# Patient Record
Sex: Male | Born: 1970 | Race: White | Hispanic: No | Marital: Single | Smoking: Never smoker
Health system: Southern US, Community
[De-identification: ages and names within clinical notes are randomized; demographics above are authoritative.]

## PROBLEM LIST (undated history)

## (undated) DIAGNOSIS — J939 Pneumothorax, unspecified: Secondary | ICD-10-CM

## (undated) DIAGNOSIS — F1010 Alcohol abuse, uncomplicated: Secondary | ICD-10-CM

## (undated) DIAGNOSIS — J45909 Unspecified asthma, uncomplicated: Secondary | ICD-10-CM

## (undated) HISTORY — PX: LUNG SURGERY: SHX703

---

## 2007-10-24 ENCOUNTER — Emergency Department: Payer: Self-pay | Admitting: Emergency Medicine

## 2008-08-23 ENCOUNTER — Emergency Department: Payer: Self-pay | Admitting: Emergency Medicine

## 2008-10-01 ENCOUNTER — Emergency Department: Payer: Self-pay | Admitting: Emergency Medicine

## 2008-10-14 ENCOUNTER — Emergency Department: Payer: Self-pay | Admitting: Emergency Medicine

## 2008-11-12 ENCOUNTER — Emergency Department: Payer: Self-pay | Admitting: Emergency Medicine

## 2009-05-07 ENCOUNTER — Ambulatory Visit: Payer: Self-pay | Admitting: Gastroenterology

## 2009-12-06 ENCOUNTER — Inpatient Hospital Stay: Payer: Self-pay | Admitting: *Deleted

## 2010-09-29 ENCOUNTER — Inpatient Hospital Stay: Payer: Self-pay | Admitting: Internal Medicine

## 2011-07-31 IMAGING — CR DG CHEST 2V
1 series · 3 of 3 positions shown · non-contrast
Comparison: none

REASON FOR EXAM: COUGH, ELEVATED WBC
COMMENTS:

PROCEDURE:     DXR - DXR CHEST PA (OR AP) AND LATERAL  - December 06, 2009  [DATE]
RESULT:     Comparison is made to the study 23 August, 2008.
The lungs are well-expanded and clear. The heart is normal in size. The
pulmonary vascularity is not engorged. There is no pleural effusion.

[Series 1: view not recorded · 0.17mm/px · 3 of 3 slices shown]
[im 1/3]
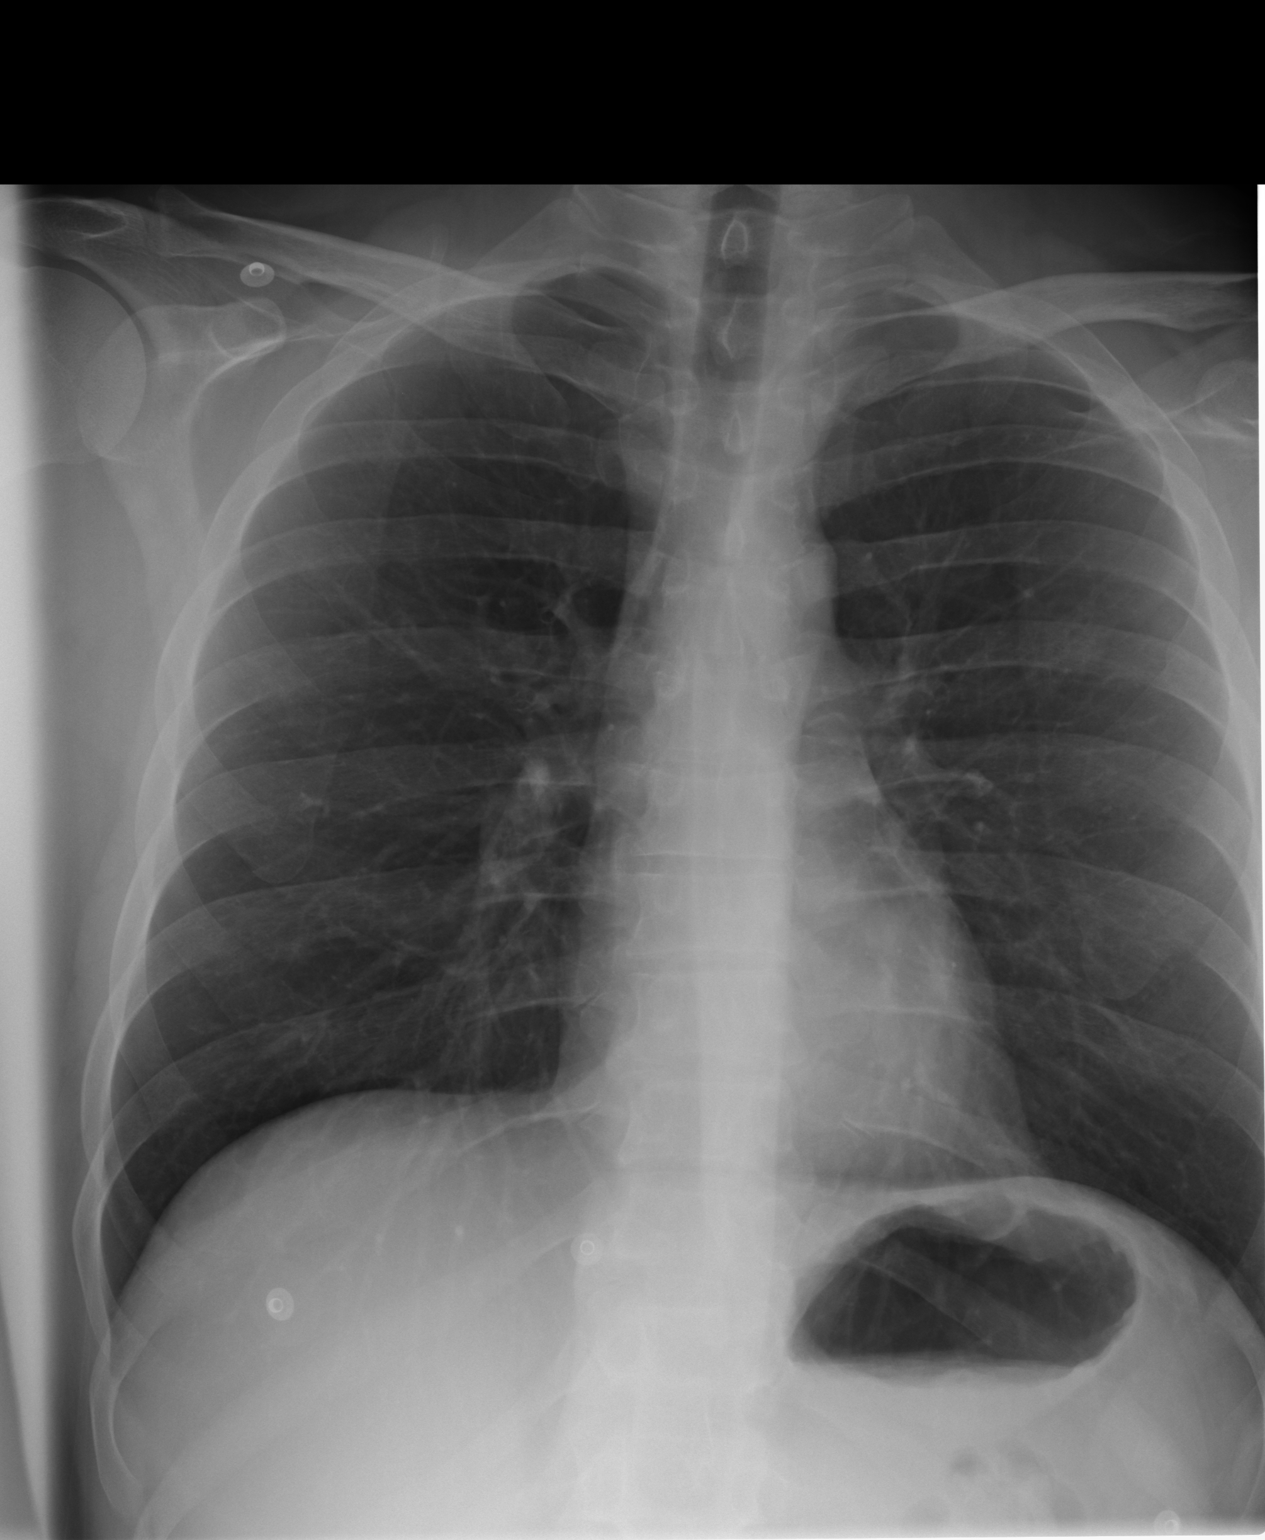
[im 2/3]
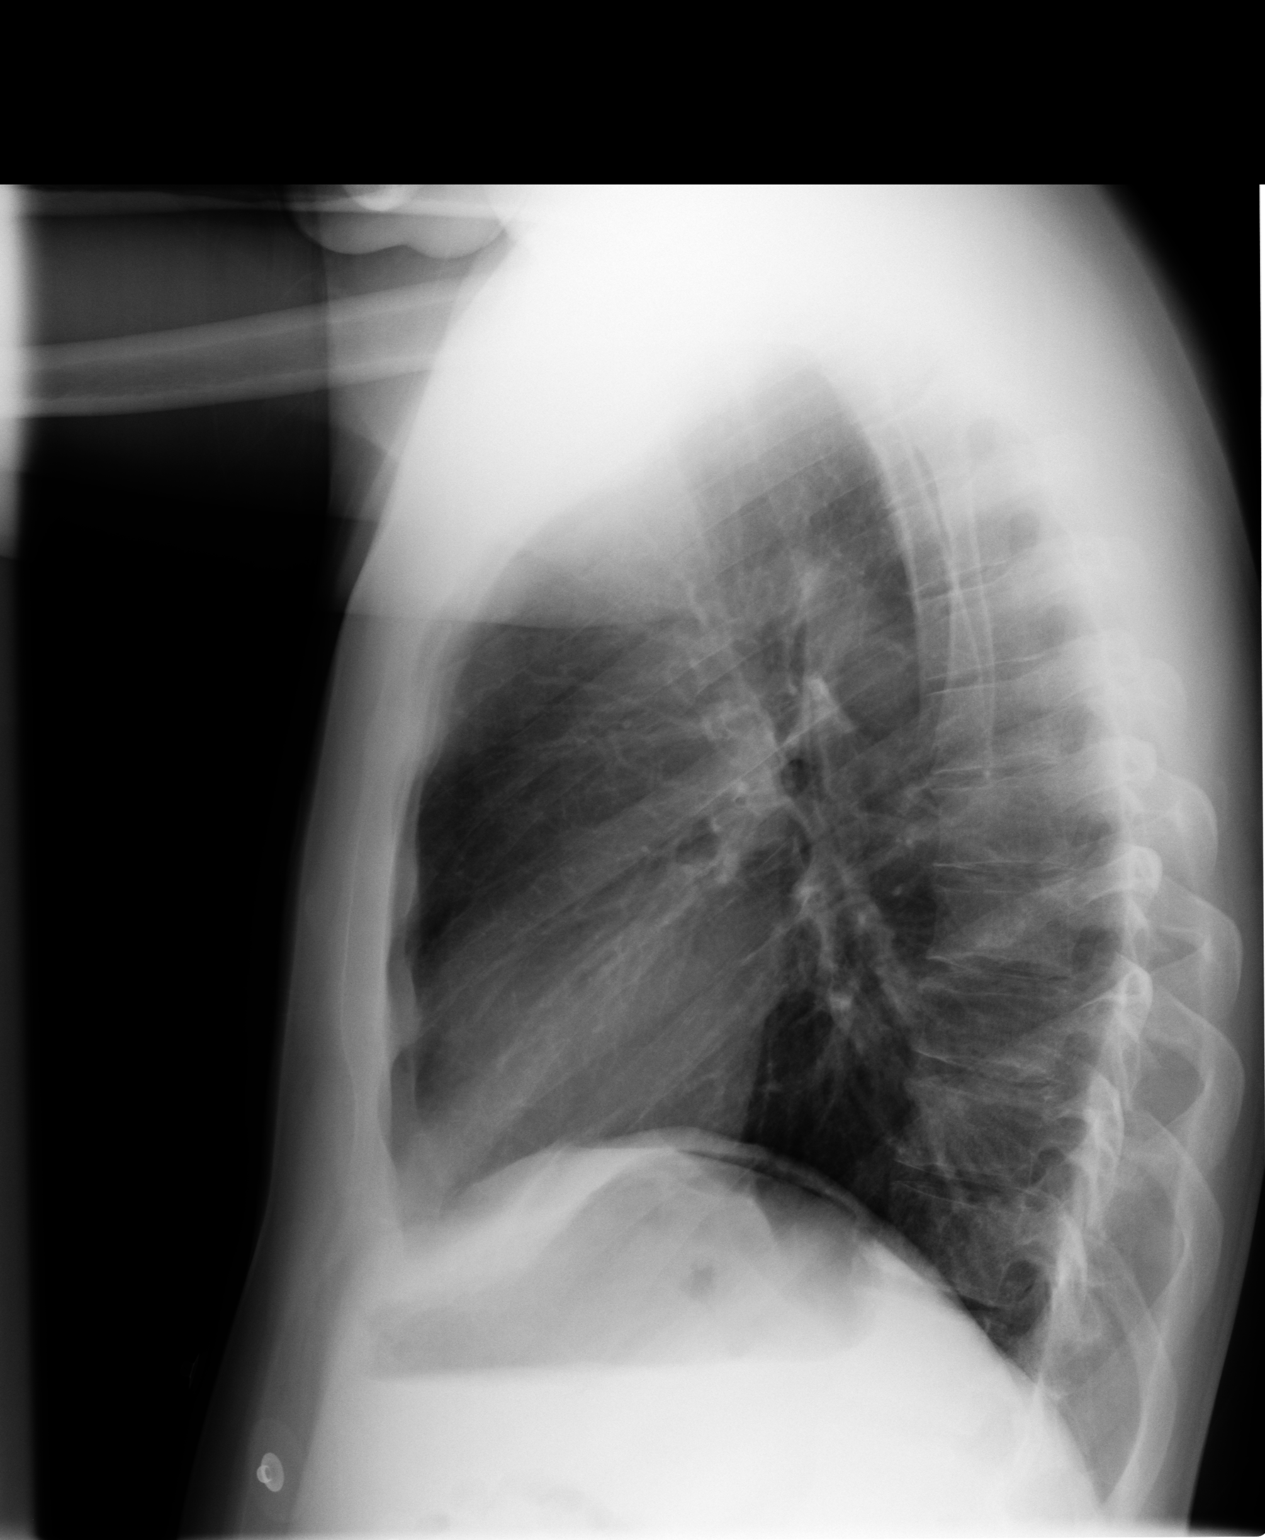
[im 3/3]
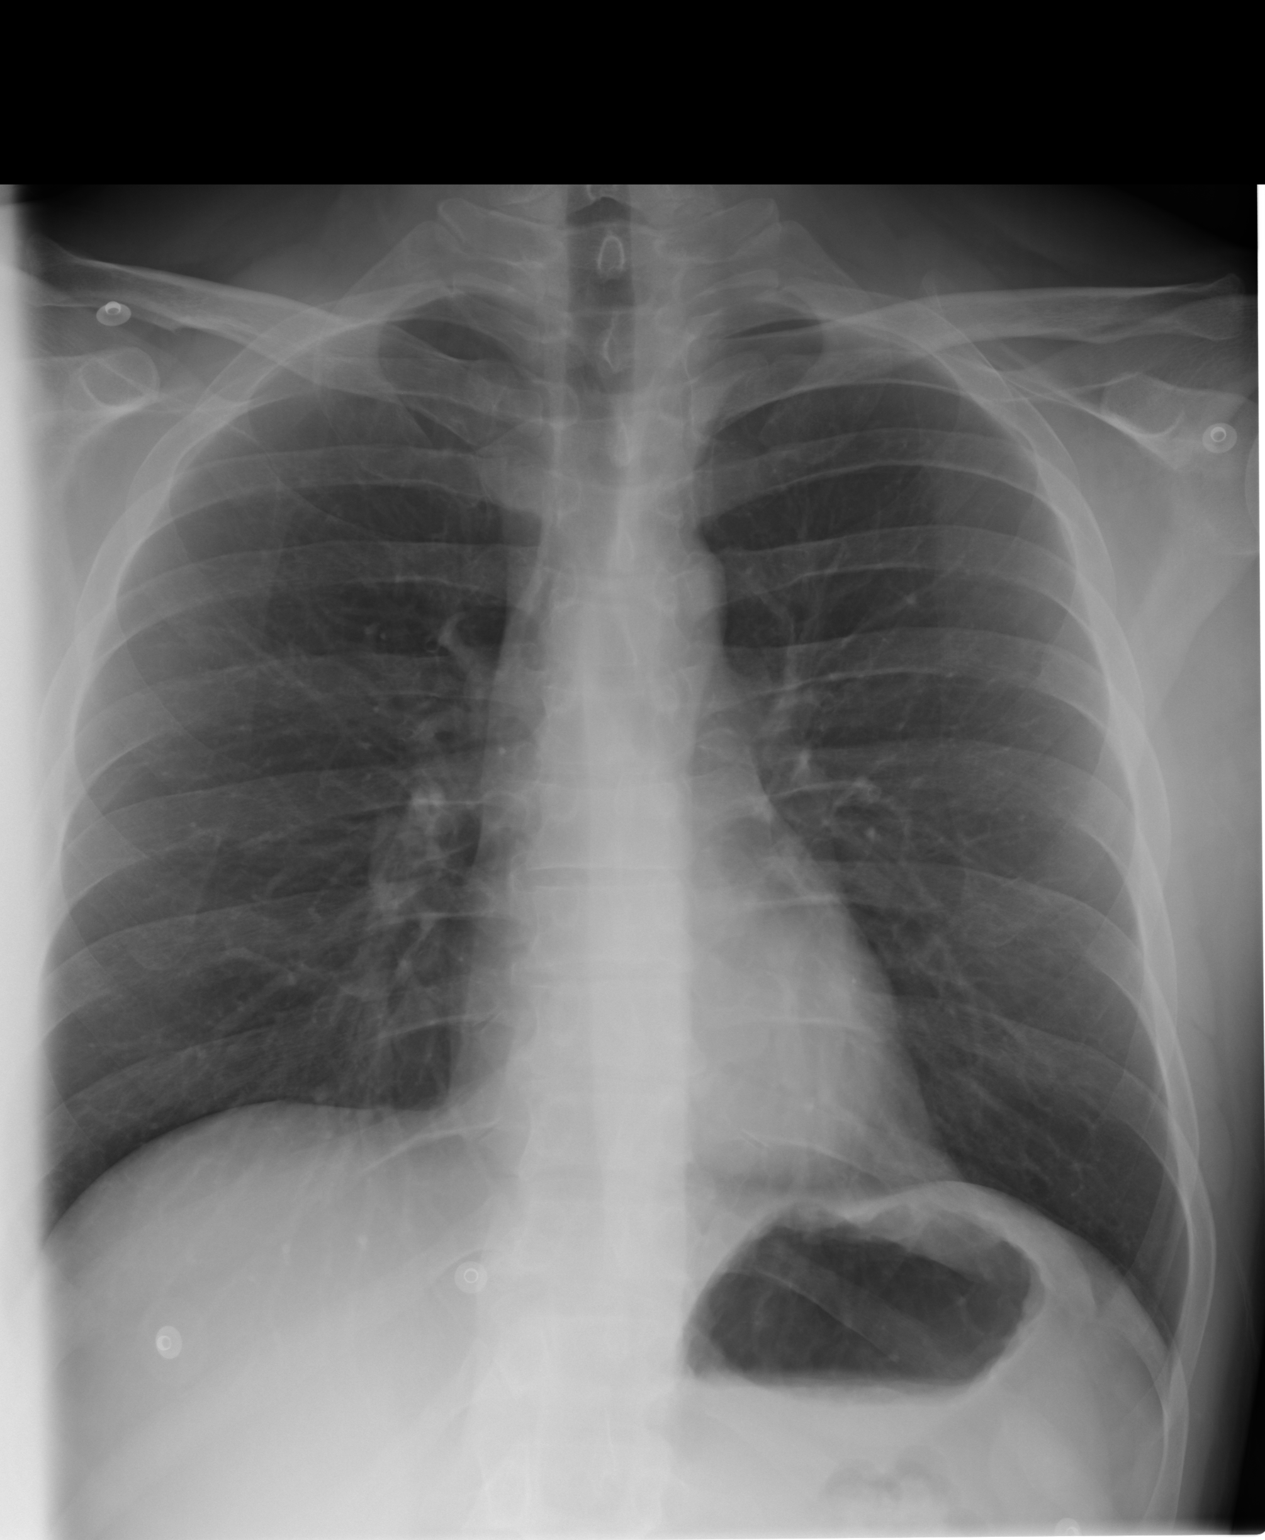

[3 of 3 positions shown; findings below may reference images not displayed]

IMPRESSION: I do not see evidence of acute cardiopulmonary abnormality.

## 2012-02-09 ENCOUNTER — Ambulatory Visit: Payer: Self-pay | Admitting: Internal Medicine

## 2012-02-12 ENCOUNTER — Ambulatory Visit: Payer: Self-pay | Admitting: Orthopedic Surgery

## 2012-07-19 ENCOUNTER — Emergency Department: Payer: Self-pay | Admitting: Emergency Medicine

## 2012-07-19 LAB — URINALYSIS, COMPLETE
Bilirubin,UR: NEGATIVE
Glucose,UR: NEGATIVE mg/dL (ref 0–75)
Ketone: NEGATIVE
RBC,UR: <1 /HPF (ref 0–5)
Specific Gravity: 1.003 (ref 1.003–1.030)
Squamous Epithelial: <1

## 2012-07-19 LAB — CBC
HCT: 46.4 % (ref 40.0–52.0)
MCH: 31.9 pg (ref 26.0–34.0)
MCHC: 33 g/dL (ref 32.0–36.0)
MCV: 97 fL (ref 80–100)
RBC: 4.79 10*6/uL (ref 4.40–5.90)
WBC: 6.6 10*3/uL (ref 3.8–10.6)

## 2012-07-19 LAB — COMPREHENSIVE METABOLIC PANEL
Albumin: 4.4 g/dL (ref 3.4–5.0)
Anion Gap: 13 (ref 7–16)
BUN: 6 mg/dL — ABNORMAL LOW (ref 7–18)
Bilirubin,Total: 0.4 mg/dL (ref 0.2–1.0)
Chloride: 99 mmol/L (ref 98–107)
Co2: 23 mmol/L (ref 21–32)
Creatinine: 0.72 mg/dL (ref 0.60–1.30)
EGFR (African American): >60
SGPT (ALT): 53 U/L (ref 12–78)
Total Protein: 8.8 g/dL — ABNORMAL HIGH (ref 6.4–8.2)

## 2012-07-19 LAB — DRUG SCREEN, URINE
Amphetamines, Ur Screen: NEGATIVE (ref ?–1000)
Barbiturates, Ur Screen: NEGATIVE (ref ?–200)
Benzodiazepine, Ur Scrn: NEGATIVE (ref ?–200)
Cannabinoid 50 Ng, Ur ~~LOC~~: NEGATIVE (ref ?–50)
Methadone, Ur Screen: NEGATIVE (ref ?–300)

## 2012-07-19 LAB — TSH: Thyroid Stimulating Horm: 0.78 u[IU]/mL

## 2012-07-20 LAB — ETHANOL: Ethanol %: <0.003 % (ref 0.000–0.080)

## 2012-07-22 ENCOUNTER — Emergency Department: Payer: Self-pay | Admitting: Unknown Physician Specialty

## 2013-05-24 ENCOUNTER — Emergency Department: Payer: Self-pay | Admitting: Emergency Medicine

## 2013-05-24 LAB — COMPREHENSIVE METABOLIC PANEL
Albumin: 4.1 g/dL (ref 3.4–5.0)
Alkaline Phosphatase: 72 U/L
Anion Gap: 6 — ABNORMAL LOW (ref 7–16)
BUN: 6 mg/dL — ABNORMAL LOW (ref 7–18)
Bilirubin,Total: 0.2 mg/dL (ref 0.2–1.0)
Co2: 29 mmol/L (ref 21–32)
EGFR (African American): >60
EGFR (Non-African Amer.): >60
Glucose: 96 mg/dL (ref 65–99)
Osmolality: 279 (ref 275–301)
Potassium: 4.2 mmol/L (ref 3.5–5.1)
SGOT(AST): 70 U/L — ABNORMAL HIGH (ref 15–37)
SGPT (ALT): 62 U/L (ref 12–78)
Sodium: 141 mmol/L (ref 136–145)

## 2013-05-24 LAB — CBC
HGB: 14.2 g/dL (ref 13.0–18.0)
MCH: 32.4 pg (ref 26.0–34.0)
Platelet: 270 10*3/uL (ref 150–440)
WBC: 5.3 10*3/uL (ref 3.8–10.6)

## 2013-05-24 LAB — DRUG SCREEN, URINE
Amphetamines, Ur Screen: NEGATIVE (ref ?–1000)
Barbiturates, Ur Screen: NEGATIVE (ref ?–200)
Benzodiazepine, Ur Scrn: NEGATIVE (ref ?–200)
Cannabinoid 50 Ng, Ur ~~LOC~~: NEGATIVE (ref ?–50)
Cocaine Metabolite,Ur ~~LOC~~: NEGATIVE (ref ?–300)
MDMA (Ecstasy)Ur Screen: NEGATIVE (ref ?–500)
Methadone, Ur Screen: NEGATIVE (ref ?–300)
Opiate, Ur Screen: NEGATIVE (ref ?–300)
Tricyclic, Ur Screen: NEGATIVE (ref ?–1000)

## 2013-05-24 LAB — URINALYSIS, COMPLETE
Bacteria: NONE SEEN
Bilirubin,UR: NEGATIVE
Blood: NEGATIVE
Ketone: NEGATIVE
Leukocyte Esterase: NEGATIVE
Nitrite: NEGATIVE
Ph: 5 (ref 4.5–8.0)
Protein: NEGATIVE
Squamous Epithelial: <1
WBC UR: <1 /HPF (ref 0–5)

## 2013-05-24 LAB — ETHANOL
Ethanol %: 0.409 % (ref 0.000–0.080)
Ethanol: 409 mg/dL

## 2013-05-24 LAB — SALICYLATE LEVEL: Salicylates, Serum: <1.7 mg/dL

## 2013-05-26 LAB — COMPREHENSIVE METABOLIC PANEL
Albumin: 3.7 g/dL (ref 3.4–5.0)
Anion Gap: 5 — ABNORMAL LOW (ref 7–16)
Bilirubin,Total: 0.2 mg/dL (ref 0.2–1.0)
Co2: 26 mmol/L (ref 21–32)
Creatinine: 0.89 mg/dL (ref 0.60–1.30)
EGFR (African American): >60
EGFR (Non-African Amer.): >60
Potassium: 4.2 mmol/L (ref 3.5–5.1)
SGOT(AST): 57 U/L — ABNORMAL HIGH (ref 15–37)
SGPT (ALT): 52 U/L (ref 12–78)
Sodium: 136 mmol/L (ref 136–145)
Total Protein: 7.9 g/dL (ref 6.4–8.2)

## 2013-05-26 LAB — ETHANOL
Ethanol %: 0.431 % (ref 0.000–0.080)
Ethanol: 431 mg/dL

## 2013-05-26 LAB — CBC
MCHC: 33.8 g/dL (ref 32.0–36.0)
RDW: 13.2 % (ref 11.5–14.5)

## 2013-05-27 ENCOUNTER — Inpatient Hospital Stay: Payer: Self-pay | Admitting: Psychiatry

## 2013-05-27 LAB — URINALYSIS, COMPLETE
Bilirubin,UR: NEGATIVE
Leukocyte Esterase: NEGATIVE
RBC,UR: <1 /HPF (ref 0–5)
Specific Gravity: 1.017 (ref 1.003–1.030)
Squamous Epithelial: <1
Transitional Epi: <1
WBC UR: <1 /HPF (ref 0–5)

## 2013-05-27 LAB — DRUG SCREEN, URINE
Benzodiazepine, Ur Scrn: POSITIVE (ref ?–200)
Cocaine Metabolite,Ur ~~LOC~~: NEGATIVE (ref ?–300)
MDMA (Ecstasy)Ur Screen: NEGATIVE (ref ?–500)
Methadone, Ur Screen: NEGATIVE (ref ?–300)
Phencyclidine (PCP) Ur S: NEGATIVE (ref ?–25)
Tricyclic, Ur Screen: NEGATIVE (ref ?–1000)

## 2013-06-01 ENCOUNTER — Emergency Department: Payer: Self-pay | Admitting: Emergency Medicine

## 2013-06-01 LAB — COMPREHENSIVE METABOLIC PANEL
ALK PHOS: 49 U/L
Albumin: 3.7 g/dL (ref 3.4–5.0)
Anion Gap: 11 (ref 7–16)
BUN: 12 mg/dL (ref 7–18)
Bilirubin,Total: 0.2 mg/dL (ref 0.2–1.0)
CHLORIDE: 106 mmol/L (ref 98–107)
CREATININE: 1 mg/dL (ref 0.60–1.30)
Calcium, Total: 8.6 mg/dL (ref 8.5–10.1)
Co2: 24 mmol/L (ref 21–32)
Glucose: 118 mg/dL — ABNORMAL HIGH (ref 65–99)
Osmolality: 282 (ref 275–301)
Potassium: 3.5 mmol/L (ref 3.5–5.1)
SGOT(AST): 50 U/L — ABNORMAL HIGH (ref 15–37)
SGPT (ALT): 46 U/L (ref 12–78)
SODIUM: 141 mmol/L (ref 136–145)
Total Protein: 7.7 g/dL (ref 6.4–8.2)

## 2013-06-01 LAB — CBC
HCT: 44.9 % (ref 40.0–52.0)
HGB: 15.3 g/dL (ref 13.0–18.0)
MCH: 33.5 pg (ref 26.0–34.0)
MCHC: 34 g/dL (ref 32.0–36.0)
MCV: 98 fL (ref 80–100)
Platelet: 276 10*3/uL (ref 150–440)
RBC: 4.57 10*6/uL (ref 4.40–5.90)
RDW: 13.3 % (ref 11.5–14.5)
WBC: 6.8 10*3/uL (ref 3.8–10.6)

## 2013-06-01 LAB — ACETAMINOPHEN LEVEL: Acetaminophen: <2 ug/mL

## 2013-06-01 LAB — URINALYSIS, COMPLETE
BACTERIA: NONE SEEN
BILIRUBIN, UR: NEGATIVE
Blood: NEGATIVE
GLUCOSE, UR: NEGATIVE mg/dL (ref 0–75)
Hyaline Cast: 6
KETONE: NEGATIVE
Leukocyte Esterase: NEGATIVE
Nitrite: NEGATIVE
Ph: 5 (ref 4.5–8.0)
Protein: NEGATIVE
RBC,UR: NONE SEEN /HPF (ref 0–5)
SPECIFIC GRAVITY: 1.015 (ref 1.003–1.030)
SQUAMOUS EPITHELIAL: NONE SEEN
WBC UR: <1 /HPF (ref 0–5)

## 2013-06-01 LAB — DRUG SCREEN, URINE
Amphetamines, Ur Screen: NEGATIVE (ref ?–1000)
BENZODIAZEPINE, UR SCRN: POSITIVE (ref ?–200)
Barbiturates, Ur Screen: NEGATIVE (ref ?–200)
CANNABINOID 50 NG, UR ~~LOC~~: NEGATIVE (ref ?–50)
Cocaine Metabolite,Ur ~~LOC~~: NEGATIVE (ref ?–300)
MDMA (Ecstasy)Ur Screen: NEGATIVE (ref ?–500)
METHADONE, UR SCREEN: NEGATIVE (ref ?–300)
Opiate, Ur Screen: NEGATIVE (ref ?–300)
Phencyclidine (PCP) Ur S: NEGATIVE (ref ?–25)
Tricyclic, Ur Screen: NEGATIVE (ref ?–1000)

## 2013-06-01 LAB — ETHANOL
Ethanol %: 0.22 % — ABNORMAL HIGH (ref 0.000–0.080)
Ethanol: 220 mg/dL

## 2013-06-01 LAB — SALICYLATE LEVEL: Salicylates, Serum: <1.7 mg/dL

## 2013-10-03 IMAGING — CT CT HEAD WITHOUT CONTRAST
1 series · 16 of 30 positions shown, 20 images · non-contrast
Comparison: none

REASON FOR EXAM: Call Report [phone] 10ft fall from tree  bruise
dazed sluggish
COMMENTS:

PROCEDURE:     ROQUEL - SINDI THALER WITHOUT CONTRAST  - February 09, 2012 [DATE]
RESULT:     Comparison:  None
TECHNIQUE: Multiple axial images from the foramen magnum to the vertex were
obtained without IV contrast.

[Series 2: soft tissue · axial · 0.40mm/px · z∈[-69,+86]mm · 16 of 35 slices shown, 20 images]
[im 2/35  brain]
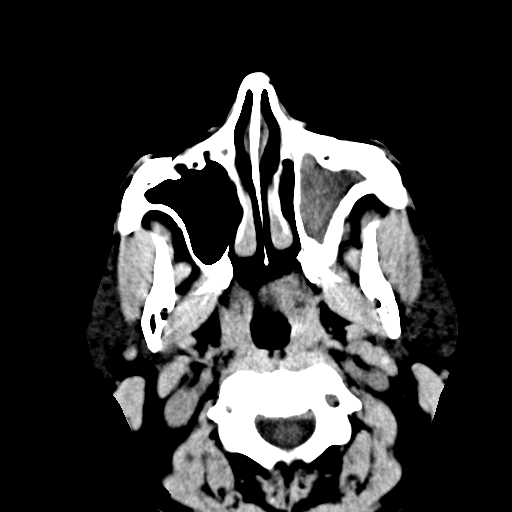
[im 2/35  bone]
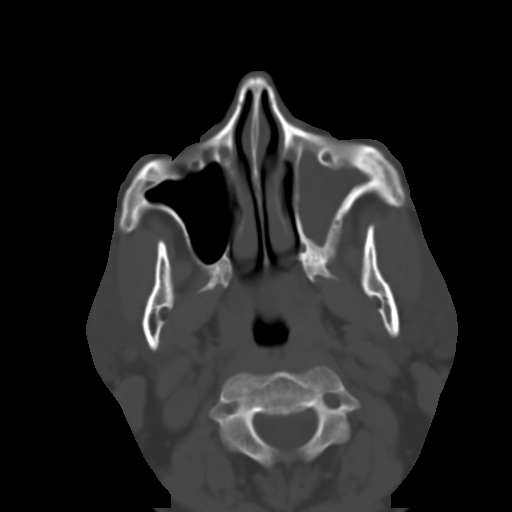
[im 4/35  brain]
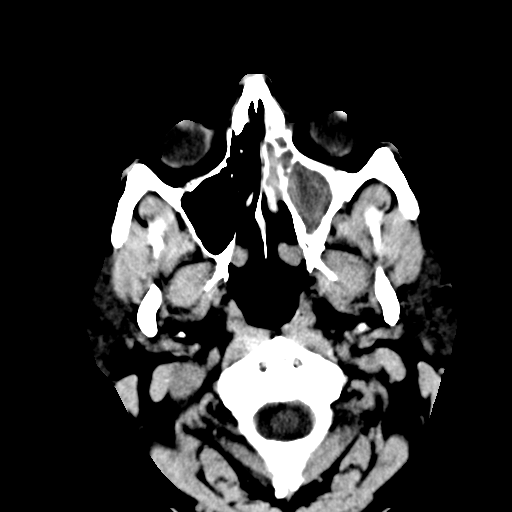
[im 6/35  brain]
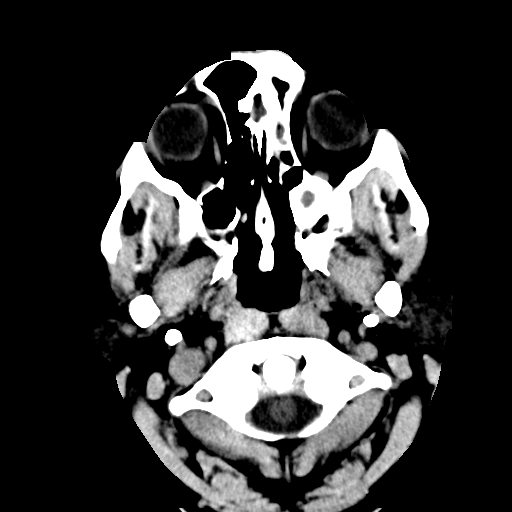
[im 9/35  brain]
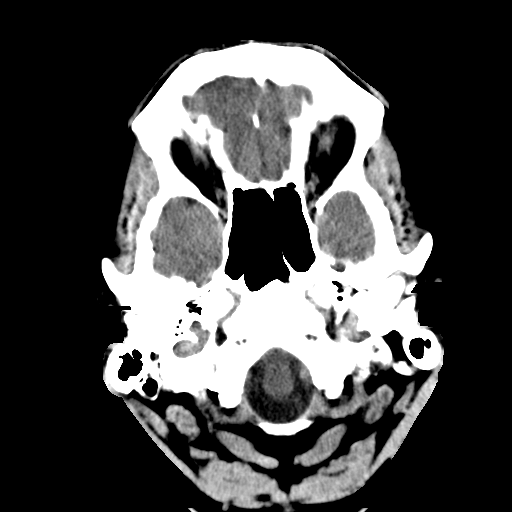
[im 10/35  brain]
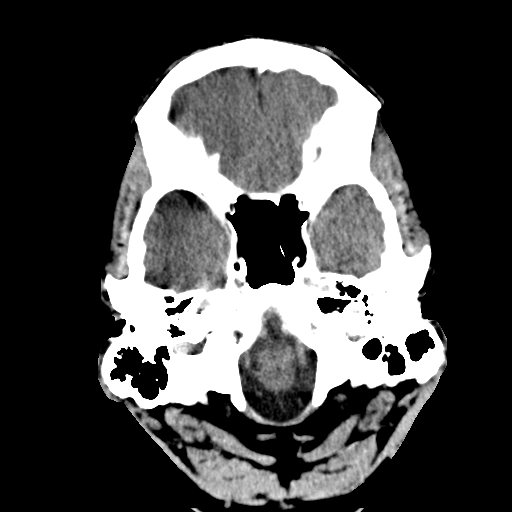
[im 10/35  bone]
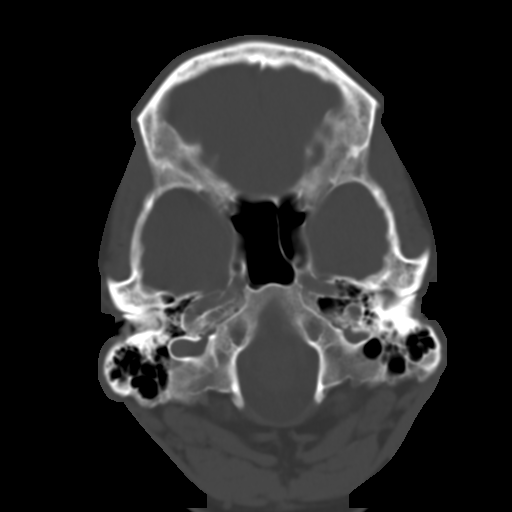
[im 12/35  brain]
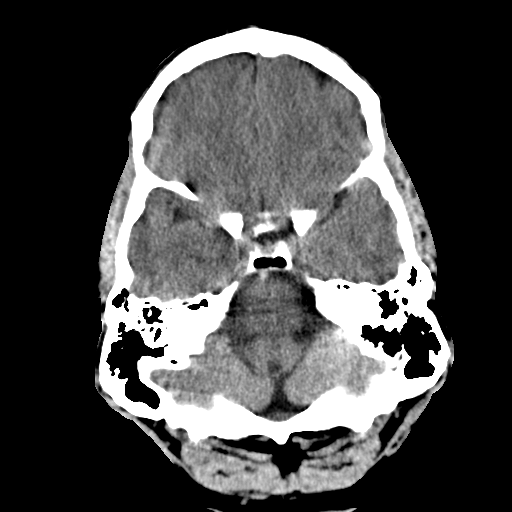
[im 15/35  brain]
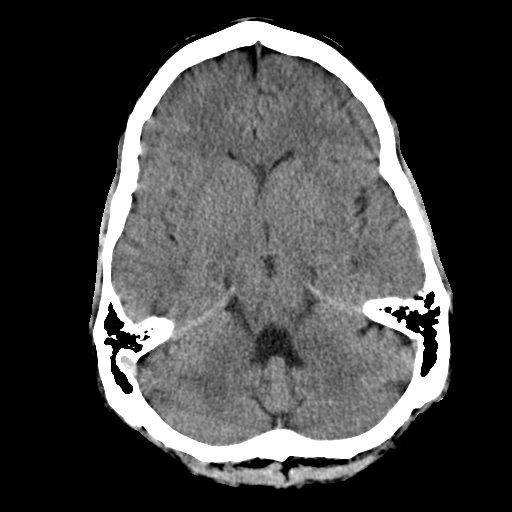
[im 17/35  brain]
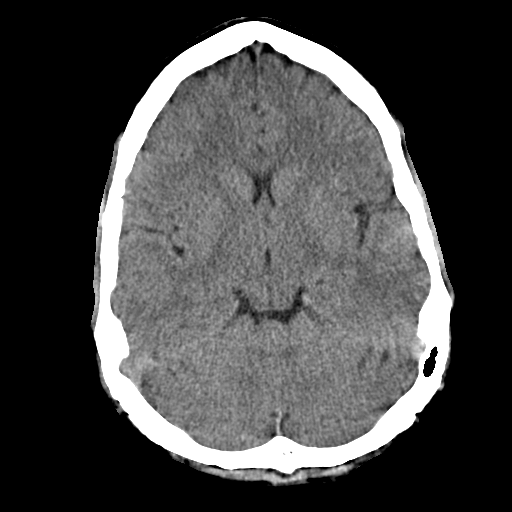
[im 18/35  brain]
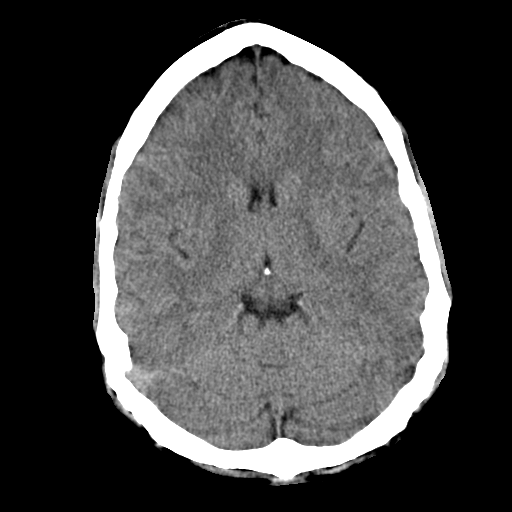
[im 18/35  bone]
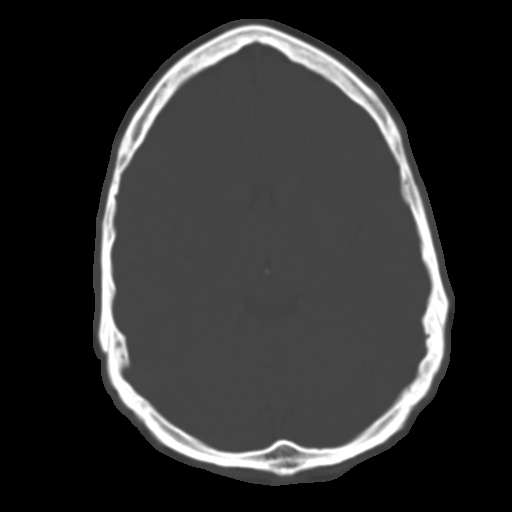
[im 20/35  brain]
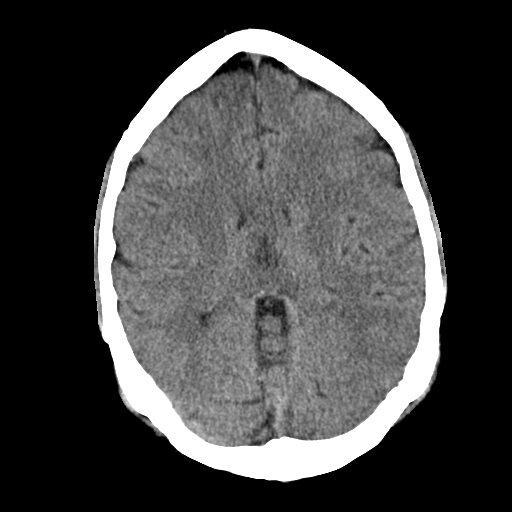
[im 23/35  brain]
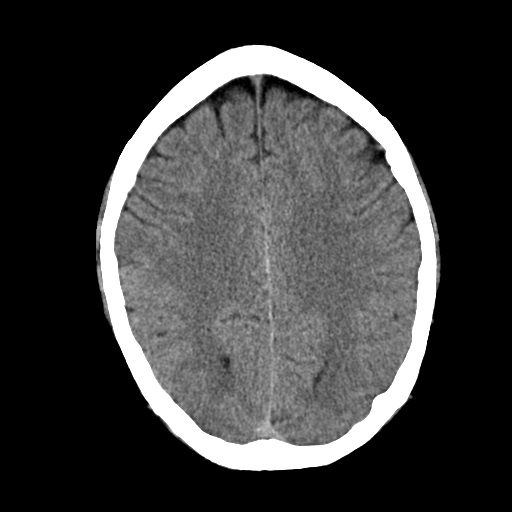
[im 25/35  brain]
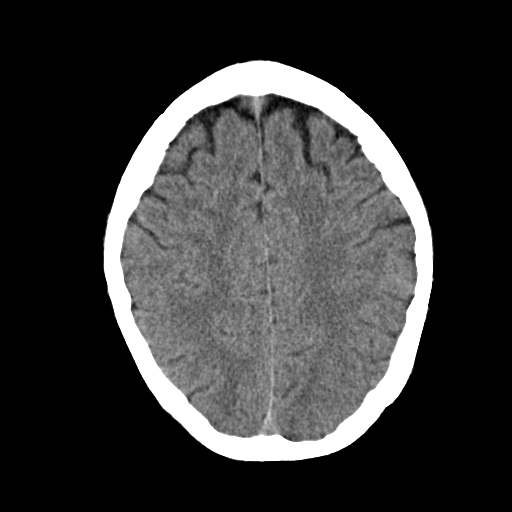
[im 26/35  brain]
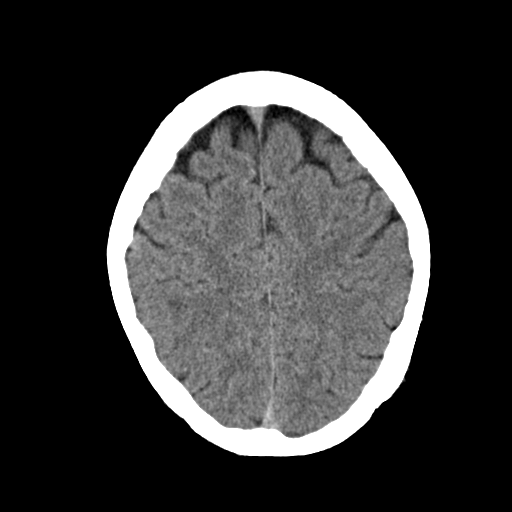
[im 26/35  bone]
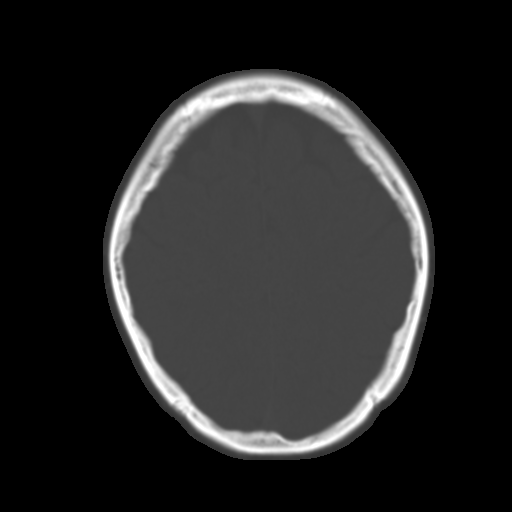
[im 29/35  brain]
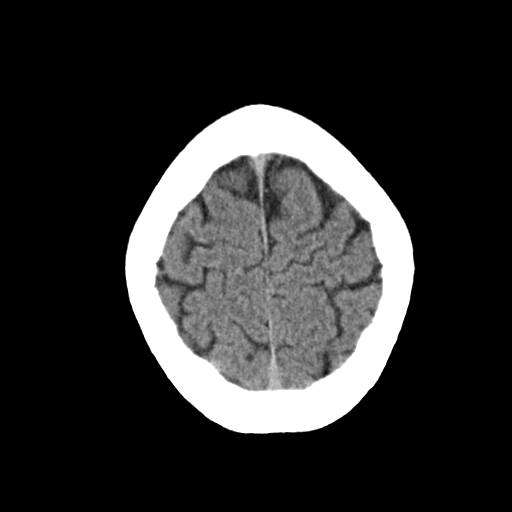
[im 31/35  brain]
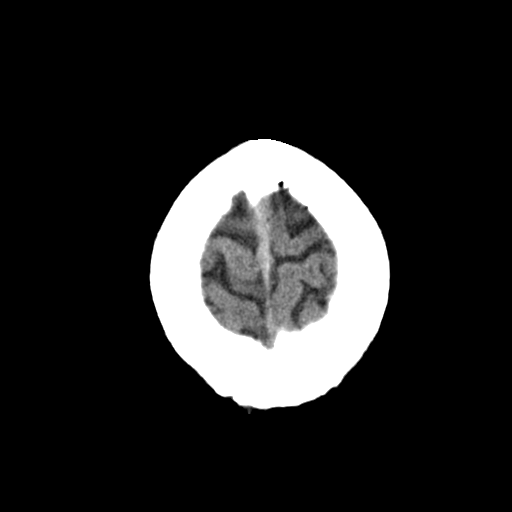
[im 33/35  brain]
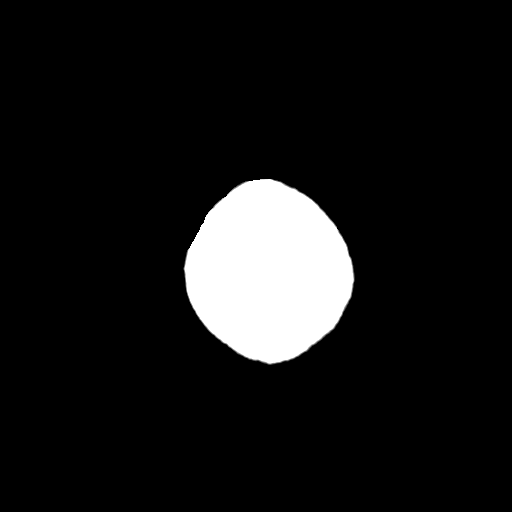

[16 of 30 positions shown; findings below may reference images not displayed]

FINDINGS: There is no evidence of mass effect, midline shift, or extra-axial fluid
collections.  There is no evidence of a space-occupying lesion or
intracranial hemorrhage. There is no evidence of a cortical-based area of
acute infarction.

The ventricles and sulci are appropriate for the patient's age. The basal
cisterns are patent.

Visualized portions of the orbits are unremarkable. There is complete
opacification of the left maxillary and ethmoid sinus.

The osseous structures are unremarkable.
IMPRESSION: No acute intracranial process.

Left maxillary and ethmoid sinus disease.

[REDACTED]

## 2014-07-23 ENCOUNTER — Emergency Department: Payer: Self-pay | Admitting: Emergency Medicine

## 2014-08-17 ENCOUNTER — Emergency Department: Payer: Self-pay | Admitting: Emergency Medicine

## 2014-09-18 NOTE — Op Note (Signed)
PATIENT NAME:  Zachary ForesterCOOK, Jeffry G MR#:  [id number] DATE OF BIRTH:  08/26/1970  DATE OF PROCEDURE:  02/12/2012  PREOPERATIVE DIAGNOSIS: Left distal radius fracture.   POSTOPERATIVE DIAGNOSIS: Left distal radius fracture.   PROCEDURE: Open reduction internal fixation left distal radius.   SURGEON: Leitha SchullerMichael J. Jaquaveon Bilal, MD   ANESTHESIA: General.    DESCRIPTION OF PROCEDURE: The patient was brought to the Operating Room, and after adequate anesthesia was obtained the left arm was prepped and draped in the usual sterile fashion with a tourniquet applied to the upper arm. After appropriate patient identification and time out procedures were completed, fingertrap traction was applied and 10 pounds of traction placed to the index and middle fingers. The tourniquet was raised to 250 mmHg. An incision was made over the FCR tendon. After the FCR tendon was exposed, the tendon was retracted to the radial side and the deep tissues exposed. The pronator was elevated off of its radial insertion, and  the fracture site was exposed with stripping of the muscle to provide exposure for the plate. After visualizing the fracture, the standard and wide plates were applied, and the wide plate gave excellent coverage over the distal radius. This was chosen as the final implant. After this was placed in the appropriate position, proximal screw holes were filled using standard technique, drilling, measuring and placing the cortical screws; and then with the wrist in flexion, distal pegs were placed with smooth pegs being placed except for the radial styloid with a variable angle screw inserted. The plate appeared to be in appropriate position on AP and lateral projections with near anatomic alignment. With traction released and passive extension and flexion, there was no motion at the fracture site. The tourniquet was let down and hemostasis checked with electrocautery. The wound was closed with 3-0 Vicryl subcutaneously and 4-0 nylon.  Sterile dressing of Xeroform, 4 x 4's, Webril, and a volar splint were applied. The patient was sent to the recovery room in stable condition.   ESTIMATED BLOOD LOSS: Minimal.   COMPLICATIONS: None.   SPECIMEN: None.   TOURNIQUET TIME: 47 minutes at 250 mmHg.   IMPLANTS: [employer] DVR wide standard plate with proximal screws and distal pegs.    ____________________________ Leitha SchullerMichael J. Sigifredo Pignato, MD mjm:cbb D: 02/12/2012 17:38:56 ET T: 02/12/2012 18:55:15 ET JOB#: [id number]  cc: Leitha SchullerMichael J. Bless Belshe, MD, <Dictator> Leitha SchullerMICHAEL J Eleanna Theilen MD ELECTRONICALLY SIGNED 02/13/2012 11:28

## 2014-09-21 NOTE — Consult Note (Signed)
Brief Consult Note: Diagnosis: Alcohol dependence.   Patient was seen by consultant.   Consult note dictated.   Recommend further assessment or treatment.   Orders entered.   Comments: [name] developed symptoms of delirium tremens with agitation, confusion, hallucinations. His last drink was 4 days ago.   PLAN: 1. At this point, the patient needs medical admission to CCU.   2. He will be transferred to psychiatry to com plete detox when stable..  Electronic Signatures: Kristine LineaPucilowska, Deandra Goering (MD)  (Signed 21-Feb-14 16:25)  Authored: Brief Consult Note   Last Updated: 21-Feb-14 16:25 by Kristine LineaPucilowska, Elleana Stillson (MD)

## 2014-09-21 NOTE — Consult Note (Signed)
Brief Consult Note: Diagnosis: Alcohol dependence.   Recommend further assessment or treatment.   Orders entered.   Comments: Psychiatry: Full consult not done. Chart reviewed. Patient known to us from previous admissions. Patient presents with a blood alcohol level over 400 as of early this afternoon. This patient has a history of alcohol-induced psychosis and delirium tremens. With his elevated alcohol level he needs more emergency stabilization for acceptance to the behavioral health unit. I have put in orders for 3 days worth of Librium Taper in addition to the usual alcohol detox. He can be reevaluated by psychiatric consultant daily and considered for possible admission if that is appropriate.  Electronic Signatures: Clapacs, Jackquline DenmarkJohn T (MD)  (Signed 24-Dec-14 16:40)  Authored: Brief Consult Note   Last Updated: 24-Dec-14 16:40 by Audery Amellapacs, John T (MD)

## 2014-09-21 NOTE — H&P (Signed)
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 The patient was divorced once and has a 44 year old daughter who lives with the mother. The patient lives with girlfriends here and there. The patient comes to inpatient psychiatry at [facility] with the chief complaint, "I need to get away from alcohol, I need detox."  HISTORY OF PRESENT ILLNESS:  The patient reports that he has been drinking at the rate of 12 cans of beer per day for many months and, in fact, he has been drinking for 10 years, and he was in detox programs on 2 occasions. Every time he was in a detox program, he stayed sober for a couple of days, then started drinking again. This time he decided to get help for the same.   PAST PSYCHIATRIC HISTORY:  History of inpatient psychiatry for detox on 2 occasions. No history of suicidal attempts. Not being followed by any psychiatrist.  FAMILY HISTORY OF MENTAL ILLNESS: None known for mental illness. No known history of suicides in the family.    FAMILY HISTORY:  Raised by parents. Father is living in his 43s. Mother is living in her 58s. Both are retired. No siblings. Close to parents.  PERSONAL HISTORY:  Born in [place]. Graduated from high school. No college. Work history:  Longest job was in [employer] and he is self-employed. Military history:  None.  Marriages: He was married once. The cause of divorce he does not know. Does not pay any child support; the girl is 43 years old. He gets to see his daughter sometimes. Alcohol and drugs:  First drink was 17 years. He believes that he has no problem with alcohol drinking, as he can  quit any time he wants to, and starts drinking again. Admits that he had one DWI. Got his driver's license back 6 years ago. Never arrested for public drunkenness. Admits that he had tremors, but no history of DTs, no history of seizures. Denies street or prescription drug abuse. Denies smoking nicotine cigarettes.   MEDICAL HISTORY:  No known high blood pressure. No known diabetes. No major surgeries. No major injuries. No history of motor vehicle accident, has never been unconscious. Not being followed by any physician. Goes to Emergency Room as needed.  ALLERGIES: No known drug allergies.   PHYSICAL EXAMINATION:  VITAL SIGNS: Temperature 97.4; blood pressure 150/90 mmHg; pulse is 82 per minute, regular; respirations 20 per minute, regular. HEENT:  Head is normocephalic, atraumatic. Eyes:  PERLA. NECK:  Supple without any organomegaly. CHEST:  Normal expansion, normal breath sounds. HEART:  Normal without any murmurs or gallops. ABDOMEN:  Soft. No organomegaly. Bowel sounds heard. RECTAL:  Deferred. NEUROLOGIC:  Gait is normal. Romberg is negative. Cranial nerves II to XII grossly intact. DTRs 2+, normal. Plantars are normal.  MENTAL STATUS EXAMINATION: The patient is dressed in street clothes. Alert and oriented to place, person and time with a little prompting and help.  Affect is neutral. Mood stable. Denies feeling depressed. Denies feeling hopeless or helpless. Denies feeling worthless or useless. No psychosis. Denies auditory or visual hallucinations. Memory is intact. Cognition intact. He could not spell the word 'world,' though  he was given a couple of chances. Insight and judgment guarded regarding his alcohol drinking and reports that he just wants to go home now.   IMPRESSION:  AXIS I:  Alcohol dependence, chronic, continuous, with intoxication at time of admission. AXIS II:  Deferred. AXIS III:  None major. AXIS IV:  Moderate. Long history of alcohol drinking. He believes that he  can quit whenever he wants to. AXIS V:  Global Assessment of Functioning: 25.  PLAN:  The patient is admitted to [facility] for close observation and management. He will be started on detox for his alcohol drinking. During his stay in the hospital, he will be given milieu therapy and supportive counseling with substance abuse issues will be addressed. At the time of discharge, the patient will get enough information on substance abuse, and social worker will make him aware of all the programs that are available in the community so that he can get help from them after discharge.   ____________________________ Jannet MantisSurya K. Guss Bundehalla, MD skc:mr D: 05/28/2013 15:34:00 ET T: 05/28/2013 17:55:41 ET JOB#: [id number]  cc: Monika SalkSurya K. Guss Bundehalla, MD, <Dictator>   Beau FannySURYA K Kavin Weckwerth MD ELECTRONICALLY SIGNED 05/29/2013 9:17

## 2014-09-22 NOTE — Consult Note (Signed)
Brief Consult Note: Diagnosis: alcohol dependence.   Patient was seen by consultant.   Consult note dictated.   Recommend further assessment or treatment.   Orders entered.   Discussed with Attending MD.   Comments: Psychiatry: PAtient seen and chart reviewwed. Patient with alcohol dependence presents intoxicated and wanting detox. Denies any SI or HI. No hx of seizures or DTs. Cooperative with treatment. He has been accepted to [facility] and will be sent theere for detox.  Electronic Signatures: Audery Amellapacs, Carzell Saldivar T (MD)  (Signed 02-Jan-15 14:28)  Authored: Brief Consult Note   Last Updated: 02-Jan-15 14:28 by Audery Amellapacs, Lilee Aldea T (MD)
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
## 2014-12-06 ENCOUNTER — Emergency Department
Admission: EM | Admit: 2014-12-06 | Discharge: 2014-12-07 | Disposition: A | Discharge status: Rehabilitation Facility | Payer: Self-pay | Attending: Emergency Medicine | Admitting: Emergency Medicine

## 2014-12-06 ENCOUNTER — Encounter: Payer: Self-pay | Admitting: *Deleted

## 2014-12-06 DIAGNOSIS — R000 Tachycardia, unspecified: Secondary | ICD-10-CM | POA: Insufficient documentation

## 2014-12-06 DIAGNOSIS — F10220 Alcohol dependence with intoxication, uncomplicated: Secondary | ICD-10-CM

## 2014-12-06 DIAGNOSIS — F1310 Sedative, hypnotic or anxiolytic abuse, uncomplicated: Secondary | ICD-10-CM | POA: Insufficient documentation

## 2014-12-06 HISTORY — DX: Alcohol abuse, uncomplicated: F10.10

## 2014-12-06 NOTE — ED Provider Notes (Signed)
[facility] Emergency Department Provider Note ____________________________________________  Time seen: Approximately 10:30 PM  I have reviewed the triage vital signs and the nursing notes.   HISTORY  Chief Complaint Alcohol Problem   HPI Zachary Boyd is a 44 y.o. male who presents to the ER requesting help to stop drinking alcohol. His last drink was approximately 4-5 hours ago. He drinks approximately 12 beers per day and has been doing so for the past 20 years. He was last sober for several months a few years ago. He has never had a seizure when he has not been drinking but does get shaky. He denies any SI. He states he was brought in by his father with him he lives and states he has a good support.  No past medical history on file.  There are no active problems to display for this patient.   No past surgical history on file.  No current outpatient prescriptions on file.  Allergies Review of patient's allergies indicates no known allergies.  No family history on file.  Social History History  Substance Use Topics  . Smoking status: Never Smoker   . Smokeless tobacco: Not on file  . Alcohol Use: Yes    Review of Systems Constitutional: No fever/chills Cardiovascular: Denies chest pain. Respiratory: Denies shortness of breath. Gastrointestinal: No abdominal pain.   musculoskeletal: Negative for back pain.  10-point ROS otherwise negative.  ____________________________________________   PHYSICAL EXAM:  VITAL SIGNS: ED Triage Vitals  Enc Vitals Group     BP 12/06/14 2000 138/92 mmHg     Pulse Rate 12/06/14 2000 114     Resp 12/06/14 2000 20     Temp 12/06/14 2000 98.4 F (36.9 C)     Temp Source 12/06/14 2000 Oral     SpO2 12/06/14 2000 95 %     Weight 12/06/14 2000 175 lb (79.379 kg)     Height 12/06/14 2000  (1.702 m)     Head Cir --      Peak Flow --      Pain Score --      Pain Loc --      Pain Edu? --      Excl. in  GC? --    Constitutional: Alert and oriented. Well appearing and in no acute distress. Eyes: Conjunctivae are normal. PERRL. EOMI. Head: Atraumatic. Nose: No congestion/rhinnorhea. Mouth/Throat: Mucous membranes are moist.  Oropharynx non-erythematous. Neck: No stridor.   Lymphatic: No cervical lymphadenopathy. Cardiovascular: Mildly Tachycardic with regular rhythm. Grossly normal heart sounds.  Peripheral pulses 2+ B Respiratory: Normal respiratory effort.  No retractions. Lungs CTAB. Gastrointestinal: Soft and nontender. No distention. Normal bowel sounds.  Musculoskeletal: No lower extremity tenderness nor edema.  No calf TTP. Neurologic:  Normal speech and language. No gross focal neurologic deficits are appreciated. Speech is normal.  Skin:  Skin is warm, dry and intact. No rash noted. Psychiatric: Mood and affect are normal. Speech and behavior are normal.  ____________________________________________   LABS (all labs ordered are listed, but only abnormal results are displayed)  Labs Reviewed  COMPREHENSIVE METABOLIC PANEL  ETHANOL  CBC WITH DIFFERENTIAL/PLATELET  URINE DRUG SCREEN, QUALITATIVE ([facility] ONLY)   ____________________________________________ ____________________________________________   INITIAL IMPRESSION / ASSESSMENT AND PLAN / ED COURSE  Pertinent labs & imaging results that were available during my care of the patient were reviewed by me and considered in my medical decision making (see chart for details).  ----------------------------------------- 11:40 PM on 12/06/2014 -----------------------------------------  Medical clearance pending laboratory data and recheck of pulse. ED care transferred to Dr. Langston MaskerShaevitz. ____________________________________________   FINAL CLINICAL IMPRESSION(S) / ED DIAGNOSES Alcohol dependence and intoxication     Maurilio LovelyNoelle Jaliyah Fotheringham, MD 12/06/14 2340

## 2014-12-06 NOTE — ED Notes (Addendum)
BEHAVIORAL HEALTH ROUNDING Patient sleeping: NO Patient alert and oriented: YES Behavior appropriate: YES Nutrition and fluids offered: YES Toileting and hygiene offered: YES Sitter present: YES Law enforcement present: YES  ENVIRONMENTAL ASSESSMENT Potentially harmful objects out of patient reach: YES Personal belongings secured: YES Patient dressed in hospital provided attire only: YES Plastic bags out of patient reach: YES Patient care equipment (cords, cables, call bells, lines, and drains) shortened, removed, or accounted for: YES Equipment and supplies removed from bottom of stretcher: YES Potentially toxic materials out of patient reach: YES Sharps container removed or out of patient reach: YES  

## 2014-12-06 NOTE — ED Notes (Signed)
Pt here for alcohol detox.  Pt brought in by father.  Pt denies drug use.  Denies SI or HI.

## 2014-12-07 ENCOUNTER — Encounter: Payer: Self-pay | Admitting: Emergency Medicine

## 2014-12-07 LAB — URINE DRUG SCREEN, QUALITATIVE (ARMC ONLY)
Amphetamines, Ur Screen: NOT DETECTED
BENZODIAZEPINE, UR SCRN: DETECTED — AB
Barbiturates, Ur Screen: NOT DETECTED
Cannabinoid 50 Ng, Ur ~~LOC~~: NOT DETECTED
Cocaine Metabolite,Ur ~~LOC~~: NOT DETECTED
MDMA (Ecstasy)Ur Screen: NOT DETECTED
Methadone Scn, Ur: NOT DETECTED
Opiate, Ur Screen: NOT DETECTED
PHENCYCLIDINE (PCP) UR S: NOT DETECTED
Tricyclic, Ur Screen: NOT DETECTED

## 2014-12-07 LAB — COMPREHENSIVE METABOLIC PANEL
ALT: 31 U/L (ref 17–63)
ANION GAP: 13 (ref 5–15)
AST: 45 U/L — ABNORMAL HIGH (ref 15–41)
Albumin: 4.4 g/dL (ref 3.5–5.0)
Alkaline Phosphatase: 52 U/L (ref 38–126)
BILIRUBIN TOTAL: 0.2 mg/dL — AB (ref 0.3–1.2)
BUN: 16 mg/dL (ref 6–20)
CHLORIDE: 105 mmol/L (ref 101–111)
CO2: 23 mmol/L (ref 22–32)
Calcium: 8.9 mg/dL (ref 8.9–10.3)
Creatinine, Ser: 1.08 mg/dL (ref 0.61–1.24)
GFR calc Af Amer: >60 mL/min (ref >60)
GLUCOSE: 107 mg/dL — AB (ref 65–99)
Potassium: 3.9 mmol/L (ref 3.5–5.1)
Sodium: 141 mmol/L (ref 135–145)
Total Protein: 7.8 g/dL (ref 6.5–8.1)

## 2014-12-07 LAB — ETHANOL
ALCOHOL ETHYL (B): 188 mg/dL — AB (ref <5)
Alcohol, Ethyl (B): 341 mg/dL (ref <5)

## 2014-12-07 LAB — CBC WITH DIFFERENTIAL/PLATELET
BASOS ABS: 0 10*3/uL (ref 0–0.1)
BASOS PCT: 1 %
Eosinophils Absolute: 0.1 10*3/uL (ref 0–0.7)
Eosinophils Relative: 2 %
HEMATOCRIT: 42.5 % (ref 40.0–52.0)
HEMOGLOBIN: 14.3 g/dL (ref 13.0–18.0)
Lymphocytes Relative: 44 %
Lymphs Abs: 3 10*3/uL (ref 1.0–3.6)
MCH: 32.2 pg (ref 26.0–34.0)
MCHC: 33.6 g/dL (ref 32.0–36.0)
MCV: 95.7 fL (ref 80.0–100.0)
Monocytes Absolute: 0.4 10*3/uL (ref 0.2–1.0)
Monocytes Relative: 6 %
NEUTROS ABS: 3.3 10*3/uL (ref 1.4–6.5)
NEUTROS PCT: 47 %
Platelets: 222 10*3/uL (ref 150–440)
RBC: 4.44 MIL/uL (ref 4.40–5.90)
RDW: 14.6 % — AB (ref 11.5–14.5)
WBC: 6.9 10*3/uL (ref 3.8–10.6)

## 2014-12-07 MED ORDER — LORAZEPAM 2 MG PO TABS
0.0000 mg | ORAL_TABLET | Freq: Four times a day (QID) | ORAL | Status: DC
  Administered 2014-12-07: 2 mg via ORAL
  Filled 2014-12-07: qty 1

## 2014-12-07 MED ORDER — LORAZEPAM 1 MG PO TABS
ORAL_TABLET | ORAL | Status: AC
  Filled 2014-12-07: qty 1

## 2014-12-07 MED ORDER — LORAZEPAM 2 MG/ML IJ SOLN: 0.0000 mg | Freq: Two times a day (BID) | INTRAMUSCULAR | Status: DC

## 2014-12-07 MED ORDER — VITAMIN B-1 100 MG PO TABS
100.0000 mg | ORAL_TABLET | Freq: Every day | ORAL | Status: DC
  Administered 2014-12-07: 100 mg via ORAL
  Filled 2014-12-07: qty 1

## 2014-12-07 MED ORDER — THIAMINE HCL 100 MG/ML IJ SOLN: 100.0000 mg | Freq: Every day | INTRAMUSCULAR | Status: DC

## 2014-12-07 MED ORDER — LORAZEPAM 1 MG PO TABS
1.0000 mg | ORAL_TABLET | Freq: Once | ORAL | Status: AC
  Administered 2014-12-07: 1 mg via ORAL

## 2014-12-07 MED ORDER — LORAZEPAM 2 MG/ML IJ SOLN: 0.0000 mg | Freq: Four times a day (QID) | INTRAMUSCULAR | Status: DC

## 2014-12-07 MED ORDER — LORAZEPAM 2 MG PO TABS
0.0000 mg | ORAL_TABLET | Freq: Two times a day (BID) | ORAL | Status: DC
  Administered 2014-12-07: 1 mg via ORAL

## 2014-12-07 NOTE — ED Notes (Signed)
Patient observed lying in bed with eyes closed  Even, unlabored respirations observed   NAD pt appears to be sleeping  I will continue to monitor along with every 15 minute visual observations and ongoing security camera monitoring    

## 2014-12-07 NOTE — ED Notes (Signed)

## 2014-12-07 NOTE — ED Notes (Signed)
Pt observed with no unusual behavior  Appropriate to stimulation  No verbalized needs or concerns at this time  NAD assessed  Continue to monitor 

## 2014-12-07 NOTE — BHH Counselor (Signed)
Discussed with pt. Plans to to be placed at [facility]. Explained to pt. [facility] placement was pending review - pt. Is in agreement with [facility] placement.  Provided psychoeducation on alcohol effects.

## 2014-12-07 NOTE — ED Notes (Signed)

## 2014-12-07 NOTE — ED Notes (Signed)
Pt awake with some tremors noted, md aware, med given. Pt states he still wants detox for etoh abuse, intake nurse made aware.

## 2014-12-07 NOTE — ED Notes (Signed)
BEHAVIORAL HEALTH ROUNDING Patient sleeping: nNo Patient alert and oriented: YES Behavior appropriate: YES Describe behavior: No inappropriate or unacceptable behaviors noted at this time.  Nutrition and fluids offered: YES Toileting and hygiene offered: YES Sitter present: Interior and spatial designerBehavioral tech rounding every 15 minutes on patient to ensure safety.  Law enforcement present: Loss adjuster, charteredYES Law enforcement agency: [employer] ([employer])

## 2014-12-07 NOTE — ED Notes (Signed)
ED BHU PLACEMENT JUSTIFICATION Is the patient under IVC or is there intent for IVC: No. Is the patient medically cleared: Yes.   Is there vacancy in the ED BHU: Yes.   Is the population mix appropriate for patient: Yes.   Is the patient awaiting placement in inpatient or outpatient setting: Yes.   Has the patient had a psychiatric consult: Yes.   Survey of unit performed for contraband, proper placement and condition of furniture, tampering with fixtures in bathroom, shower, and each patient room: Yes.  ; Findings:  APPEARANCE/BEHAVIOR calm, cooperative and adequate rapport can be established NEURO ASSESSMENT Orientation: time, place and person Hallucinations: No.None noted (Hallucinations) Speech: Normal Gait: normal RESPIRATORY ASSESSMENT Normal expansion.  Clear to auscultation.  No rales, rhonchi, or wheezing. CARDIOVASCULAR ASSESSMENT regular rate and rhythm, S1, S2 normal, no murmur, click, rub or gallop GASTROINTESTINAL ASSESSMENT soft, nontender, BS WNL, no r/g EXTREMITIES normal strength, tone, and muscle mass PLAN OF CARE Provide calm/safe environment. Vital signs assessed twice daily. ED BHU Assessment once each 12-hour shift. Collaborate with intake RN daily or as condition indicates. Assure the ED provider has rounded once each shift. Provide and encourage hygiene. Provide redirection as needed. Assess for escalating behavior; address immediately and inform ED provider.  Assess family dynamic and appropriateness for visitation as needed: Yes.  ; If necessary, describe findings:  Educate the patient/family about BHU procedures/visitation: Yes.  ; If necessary, describe findings:  

## 2014-12-07 NOTE — ED Notes (Addendum)
Clothes returned to him and he is actively getting dressed   - pt to transfer to [facility] at this time   Discharge instructions reviewed with him and he verbalized agreement and understanding 2/2 bags of belongings returned to him at this time   Pt cussing upon entering the lobby area - "My damn daddy better have put my fucking gold chains in this damn bag - I told him what to put in the suitcase and all he brought was a damn plastic bag."  Pt informed that he is an adult and his father is not to blame

## 2014-12-07 NOTE — ED Notes (Signed)
BEHAVIORAL HEALTH ROUNDING Patient sleeping: No. Patient alert and oriented: yes Behavior appropriate: Yes.  ; If no, describe:  Nutrition and fluids offered: yes Toileting and hygiene offered: Yes  Sitter present: q15 minute observations and security camera monitoring Law enforcement present: Yes  [employer]S  

## 2014-12-07 NOTE — ED Notes (Signed)
Supper provided along with an extra drink  Pt observed with no unusual behavior  Appropriate to stimulation  No verbalized needs or concerns at this time  NAD assessed  Continue to monitor 

## 2014-12-07 NOTE — ED Notes (Signed)
ED BHU PLACEMENT JUSTIFICATION Is the patient under IVC or is there intent for IVC: No. Is the patient medically cleared: Yes.   Is there vacancy in the ED BHU: yes Is the population mix appropriate for patient: Yes.   Is the patient awaiting placement in inpatient or outpatient setting: yes  Detox facility Has the patient had a psychiatric consult: No. Survey of unit performed for contraband, proper placement and condition of furniture, tampering with fixtures in bathroom, shower, and each patient room: Yes.  ; Findings:  APPEARANCE/BEHAVIOR Calm and cooperative NEURO ASSESSMENT Orientation:  Oriented x3 Hallucinations: No.None noted (Hallucinations) Speech: Normal Gait: normal RESPIRATORY ASSESSMENT Even  unlabored respirations noted  CARDIOVASCULAR ASSESSMENT Regular rate  Pulses equal  Skin warm and dry   GASTROINTESTINAL ASSESSMENT no GI complaint EXTREMITIES Full ROM  PLAN OF CARE Provide calm/safe environment. Vital signs assessed twice daily. ED BHU Assessment once each 12-hour shift. Collaborate with intake RN daily or as condition indicates. Assure the ED provider has rounded once each shift. Provide and encourage hygiene. Provide redirection as needed. Assess for escalating behavior; address immediately and inform ED provider.  Assess family dynamic and appropriateness for visitation as needed: Yes.  ; If necessary, describe findings:  Educate the patient/family about BHU procedures/visitation: Yes.  ; If necessary, describe findings:

## 2014-12-07 NOTE — ED Notes (Signed)
[facility]. Patient care assumed. Patient/RN introduction complete.  Pt without complaints at this time, calm and cooperative will continue to monitor.

## 2014-12-07 NOTE — ED Notes (Signed)
No change in condition.

## 2014-12-07 NOTE — BHH Counselor (Signed)
Faxed updated pt. BAC to [facility] (fax# [phone]). [facility] Molly Maduro(Robert, phone# [phone]) called confirming receipt.   Informed nurse [facility] would contact BHU with transport info.

## 2014-12-07 NOTE — ED Notes (Signed)
Lunch provided along with an extra drink  Pt observed with no unusual behavior  Appropriate to stimulation  No verbalized needs or concerns at this time  NAD assessed  Continue to monitor 

## 2014-12-07 NOTE — ED Notes (Signed)
Breakfast provided    Pt sat up in bed   pt denies tremors/detox symptoms and none observed at this time   Appropriate to stimulation  No verbalized needs or concerns at this time  NAD assessed  Continue to monitor

## 2014-12-07 NOTE — BH Assessment (Signed)
Assessment Note  Zachary Boyd is an 44 y.o. male presenting to the ER voluntarily for detox from alcohol.  Patient was intoxicated during this assessment.  His last drink was 4-5 hours ago.  Patient reports drinking a 12 pack of 24 oz beers per day for the past 20 years.  Patient reports having "the shakes" when he does not drink.  Patient denies and SI or HI.  Axis I: Alcohol Abuse Axis II: Deferred Axis III:  Past Medical History  Diagnosis Date  . Alcohol abuse    Axis IV: economic problems, housing problems, occupational problems and problems related to social environment Axis V: 61-70 mild symptoms  Past Medical History:  Past Medical History  Diagnosis Date  . Alcohol abuse     History reviewed. No pertinent past surgical history.  Family History: No family history on file.  Social History:  reports that he has never smoked. He does not have any smokeless tobacco history on file. He reports that he drinks alcohol. His drug history is not on file.  Additional Social History:  Alcohol / Drug Use History of alcohol / drug use?: Yes Longest period of sobriety (when/how long): "a couple of years ago" Negative Consequences of Use: Financial, Personal relationships Withdrawal Symptoms: Tremors Substance #1 Name of Substance 1: Alcohol 1 - Age of First Use: 24 1 - Amount (size/oz): 12 pack 24 oz 1 - Frequency: daily 1 - Duration: all day 1 - Last Use / Amount: 12/06/2014  CIWA: CIWA-Ar BP: 131/81 mmHg Pulse Rate: (!) 114 Nausea and Vomiting: no nausea and no vomiting Tactile Disturbances: none Tremor: no tremor Auditory Disturbances: not present Paroxysmal Sweats: no sweat visible Visual Disturbances: not present Anxiety: no anxiety, at ease Headache, Fullness in Head: none present Agitation: normal activity Orientation and Clouding of Sensorium: oriented and can do serial additions CIWA-Ar Total: 0 COWS:    Allergies: No Known Allergies  Home Medications:  (Not  in a hospital admission)  OB/GYN Status:  No LMP for male patient.  General Assessment Data Location of Assessment: [facility] Assessment: In system Is this a Tele or Face-to-Face Assessment?: Face-to-Face Is this an Initial Assessment or a Re-assessment for this encounter?: Initial Assessment Marital status: Single Maiden name: N/A Is patient pregnant?: No Pregnancy Status: No Living Arrangements: Parent Can pt return to current living arrangement?: Yes Admission Status: Voluntary Is patient capable of signing voluntary admission?: Yes Referral Source: Self/Family/Friend Insurance type: None  Medical Screening Exam [facility] Walk-in ONLY) Medical Exam completed: Yes  Crisis Care Plan Living Arrangements: Parent Name of Psychiatrist: None Name of Therapist: None  Education Status Is patient currently in school?: No Highest grade of school patient has completed: 12th  Risk to self with the past 6 months Suicidal Ideation: No Has patient been a risk to self within the past 6 months prior to admission? : No Suicidal Intent: No Has patient had any suicidal intent within the past 6 months prior to admission? : No Is patient at risk for suicide?: No Suicidal Plan?: No Has patient had any suicidal plan within the past 6 months prior to admission? : No Access to Means: No What has been your use of drugs/alcohol within the last 12 months?: Beer, 12 pack a day Previous Attempts/Gestures: No How many times?: 0 Other Self Harm Risks: None Triggers for Past Attempts: None known Intentional Self Injurious Behavior: None Family Suicide History: Unknown Recent stressful life event(s): Job Loss Persecutory voices/beliefs?: No Depression: No Depression  Symptoms: Feeling worthless/self pity Substance abuse history and/or treatment for substance abuse?: Yes Suicide prevention information given to non-admitted patients: Not applicable  Risk to Others within the past 6 months Homicidal  Ideation: No Thoughts of Harm to Others: No Current Homicidal Intent: No Current Homicidal Plan: No Access to Homicidal Means: No History of harm to others?: No Assessment of Violence: None Noted Does patient have access to weapons?: No Criminal Charges Pending?: No Does patient have a court date: No Is patient on probation?: No  Psychosis Hallucinations: None noted Delusions: None noted  Mental Status Report Appearance/Hygiene: Disheveled, In scrubs Eye Contact: Fair Motor Activity: Unsteady Speech: Slurred Level of Consciousness: Drowsy Mood: Helpless Affect: Appropriate to circumstance Anxiety Level: None Thought Processes: Flight of Ideas Judgement: Impaired Orientation: Not oriented Obsessive Compulsive Thoughts/Behaviors: None  Cognitive Functioning Concentration: Decreased Memory: Recent Impaired IQ: Average Insight: Fair Impulse Control: Poor Appetite: Fair Weight Loss: 0 Weight Gain: 0 Sleep: No Change Vegetative Symptoms: None  ADLScreening [facility] Assessment Services) Patient's cognitive ability adequate to safely complete daily activities?: Yes Patient able to express need for assistance with ADLs?: Yes Independently performs ADLs?: Yes (appropriate for developmental age)  Prior Inpatient Therapy Prior Inpatient Therapy: No  Prior Outpatient Therapy Prior Outpatient Therapy: No Does patient have an ACCT team?: No Does patient have Intensive In-House Services?  : No Does patient have [facility] services? : No Does patient have P4CC services?: No  ADL Screening (condition at time of admission) Patient's cognitive ability adequate to safely complete daily activities?: Yes Patient able to express need for assistance with ADLs?: Yes Independently performs ADLs?: Yes (appropriate for developmental age)       Abuse/Neglect Assessment (Assessment to be complete while patient is alone) Physical Abuse: Denies Verbal Abuse: Denies Sexual Abuse:  Denies Exploitation of patient/patient's resources: Denies Self-Neglect: Denies Possible abuse reported to:: [place] department of social services Values / Beliefs Spiritual Requests During Hospitalization: None Consults Spiritual Care Consult Needed: No Social Work Consult Needed: No Merchant navy officerAdvance Directives (For Healthcare) Does patient have an advance directive?: No    Additional Information 1:1 In Past 12 Months?: No CIRT Risk: No Elopement Risk: No Does patient have medical clearance?: Yes     Disposition:  Disposition Initial Assessment Completed for this Encounter: Yes Disposition of Patient: Inpatient treatment program ([facility]) Type of inpatient treatment program: Adult (Detox at [facility])  On Site Evaluation by:   Reviewed with Physician:    Artist Beachoxana C Auset Fritzler 12/07/2014 5:35 AM

## 2014-12-07 NOTE — ED Notes (Signed)
[facility] has accepted this pt into their detox program  Transportation should arrive momentarily   Discharge instructions completed by EDP we are just awaiting safe transport   Pt has been heard multiple times today cussing and yelling at his father

## 2014-12-07 NOTE — Discharge Instructions (Signed)
Alcohol Use Disorder °Alcohol use disorder is a mental disorder. It is not a one-time incident of heavy drinking. Alcohol use disorder is the excessive and uncontrollable use of alcohol over time that leads to problems with functioning in one or more areas of daily living. People with this disorder risk harming themselves and others when they drink to excess. Alcohol use disorder also can cause other mental disorders, such as mood and anxiety disorders, and serious physical problems. People with alcohol use disorder often misuse other drugs.  °Alcohol use disorder is common and widespread. Some people with this disorder drink alcohol to cope with or escape from negative life events. Others drink to relieve chronic pain or symptoms of mental illness. People with a family history of alcohol use disorder are at higher risk of losing control and using alcohol to excess.  °SYMPTOMS  °Signs and symptoms of alcohol use disorder may include the following:  °· Consumption of alcohol in larger amounts or over a longer period of time than intended. °· Multiple unsuccessful attempts to cut down or control alcohol use.   °· A great deal of time spent obtaining alcohol, using alcohol, or recovering from the effects of alcohol (hangover). °· A strong desire or urge to use alcohol (cravings).   °· Continued use of alcohol despite problems at work, school, or home because of alcohol use.   °· Continued use of alcohol despite problems in relationships because of alcohol use. °· Continued use of alcohol in situations when it is physically hazardous, such as driving a car. °· Continued use of alcohol despite awareness of a physical or psychological problem that is likely related to alcohol use. Physical problems related to alcohol use can involve the brain, heart, liver, stomach, and intestines. Psychological problems related to alcohol use include intoxication, depression, anxiety, psychosis, delirium, and dementia.   °· The need for  increased amounts of alcohol to achieve the same desired effect, or a decreased effect from the consumption of the same amount of alcohol (tolerance). °· Withdrawal symptoms upon reducing or stopping alcohol use, or alcohol use to reduce or avoid withdrawal symptoms. Withdrawal symptoms include: °¨ Racing heart. °¨ Hand tremor. °¨ Difficulty sleeping. °¨ Nausea. °¨ Vomiting. °¨ Hallucinations. °¨ Restlessness. °¨ Seizures. °DIAGNOSIS °Alcohol use disorder is diagnosed through an assessment by your health care provider. Your health care provider may start by asking three or four questions to screen for excessive or problematic alcohol use. To confirm a diagnosis of alcohol use disorder, at least two symptoms must be present within a 12-month period. The severity of alcohol use disorder depends on the number of symptoms: °· Mild--two or three. °· Moderate--four or five. °· Severe--six or more. °Your health care provider may perform a physical exam or use results from lab tests to see if you have physical problems resulting from alcohol use. Your health care provider may refer you to a mental health professional for evaluation. °TREATMENT  °Some people with alcohol use disorder are able to reduce their alcohol use to low-risk levels. Some people with alcohol use disorder need to quit drinking alcohol. When necessary, mental health professionals with specialized training in substance use treatment can help. Your health care provider can help you decide how severe your alcohol use disorder is and what type of treatment you need. The following forms of treatment are available:  °· Detoxification. Detoxification involves the use of prescription medicines to prevent alcohol withdrawal symptoms in the first week after quitting. This is important for people with a history of symptoms   of withdrawal and for heavy drinkers who are likely to have withdrawal symptoms. Alcohol withdrawal can be dangerous and, in severe cases, cause  death. Detoxification is usually provided in a hospital or in-patient substance use treatment facility.  Counseling or talk therapy. Talk therapy is provided by substance use treatment counselors. It addresses the reasons people use alcohol and ways to keep them from drinking again. The goals of talk therapy are to help people with alcohol use disorder find healthy activities and ways to cope with life stress, to identify and avoid triggers for alcohol use, and to handle cravings, which can cause relapse.  Medicines.Different medicines can help treat alcohol use disorder through the following actions:  Decrease alcohol cravings.  Decrease the positive reward response felt from alcohol use.  Produce an uncomfortable physical reaction when alcohol is used (aversion therapy).  Support groups. Support groups are run by people who have quit drinking. They provide emotional support, advice, and guidance. These forms of treatment are often combined. Some people with alcohol use disorder benefit from intensive combination treatment provided by specialized substance use treatment centers. Both inpatient and outpatient treatment programs are available. Document Released: 06/25/2004 Document Revised: 10/02/2013 Document Reviewed: 08/25/2012 [facility] Patient Information 2015 [place], [place]. This information is not intended to replace advice given to you by your health care provider. Make sure you discuss any questions you have with your health care prov  TO [facility]

## 2014-12-07 NOTE — ED Notes (Signed)

## 2014-12-07 NOTE — ED Notes (Signed)
Repeat ethanol level drawn and I delivered samples to the lab   CIWA scale is in use - meds administered as ordered    Sheets changed due to them being wet with sweat

## 2014-12-07 NOTE — ED Notes (Signed)
Pt. transfered to BHU without incident after report from. Placed in room and oriented to unit. Pt. informed that for their safety all care areas are designed for safety and monitored by security cameras at all times; and visiting hours explained to patient. Patient verbalizes understanding, and verbal contract for safety obtained.   

## 2014-12-07 NOTE — ED Notes (Signed)

## 2014-12-07 NOTE — ED Notes (Signed)
Pt laying in bed.  

## 2014-12-07 NOTE — ED Notes (Signed)
Pt laying in bed without distress noted

## 2015-02-20 ENCOUNTER — Emergency Department
Admission: EM | Admit: 2015-02-20 | Discharge: 2015-02-20 | Disposition: A | Discharge status: Home / Self Care | Payer: Self-pay | Attending: Internal Medicine | Admitting: Internal Medicine

## 2015-02-20 ENCOUNTER — Encounter: Payer: Self-pay | Admitting: Emergency Medicine

## 2015-02-20 DIAGNOSIS — F1010 Alcohol abuse, uncomplicated: Secondary | ICD-10-CM | POA: Insufficient documentation

## 2015-02-20 HISTORY — DX: Pneumothorax, unspecified: J93.9

## 2015-02-20 LAB — URINALYSIS COMPLETE WITH MICROSCOPIC (ARMC ONLY)
BILIRUBIN URINE: NEGATIVE
Bacteria, UA: NONE SEEN
GLUCOSE, UA: NEGATIVE mg/dL
HGB URINE DIPSTICK: NEGATIVE
KETONES UR: NEGATIVE mg/dL
LEUKOCYTES UA: NEGATIVE
Nitrite: NEGATIVE
PH: 6 (ref 5.0–8.0)
Protein, ur: NEGATIVE mg/dL
RBC / HPF: NONE SEEN RBC/hpf (ref 0–5)
Specific Gravity, Urine: 1.002 — ABNORMAL LOW (ref 1.005–1.030)
Squamous Epithelial / LPF: NONE SEEN
WBC, UA: NONE SEEN WBC/hpf (ref 0–5)

## 2015-02-20 LAB — CBC WITH DIFFERENTIAL/PLATELET
BASOS PCT: 1 %
Basophils Absolute: 0 10*3/uL (ref 0–0.1)
Eosinophils Absolute: 0.1 10*3/uL (ref 0–0.7)
Eosinophils Relative: 1 %
HCT: 42.3 % (ref 40.0–52.0)
HEMOGLOBIN: 14.5 g/dL (ref 13.0–18.0)
LYMPHS ABS: 2.3 10*3/uL (ref 1.0–3.6)
Lymphocytes Relative: 27 %
MCH: 31 pg (ref 26.0–34.0)
MCHC: 34.2 g/dL (ref 32.0–36.0)
MCV: 90.6 fL (ref 80.0–100.0)
MONO ABS: 0.3 10*3/uL (ref 0.2–1.0)
MONOS PCT: 4 %
Neutro Abs: 5.9 10*3/uL (ref 1.4–6.5)
Neutrophils Relative %: 67 %
Platelets: 242 10*3/uL (ref 150–440)
RBC: 4.67 MIL/uL (ref 4.40–5.90)
RDW: 13.6 % (ref 11.5–14.5)
WBC: 8.6 10*3/uL (ref 3.8–10.6)

## 2015-02-20 LAB — COMPREHENSIVE METABOLIC PANEL
ALT: 15 U/L — ABNORMAL LOW (ref 17–63)
ANION GAP: 13 (ref 5–15)
AST: 33 U/L (ref 15–41)
Albumin: 4.6 g/dL (ref 3.5–5.0)
Alkaline Phosphatase: 41 U/L (ref 38–126)
BILIRUBIN TOTAL: 0.7 mg/dL (ref 0.3–1.2)
BUN: 14 mg/dL (ref 6–20)
CALCIUM: 8.8 mg/dL — AB (ref 8.9–10.3)
CO2: 25 mmol/L (ref 22–32)
Chloride: 97 mmol/L — ABNORMAL LOW (ref 101–111)
Creatinine, Ser: 0.88 mg/dL (ref 0.61–1.24)
GFR calc Af Amer: >60 mL/min (ref >60)
GFR calc non Af Amer: >60 mL/min (ref >60)
Glucose, Bld: 105 mg/dL — ABNORMAL HIGH (ref 65–99)
Potassium: 4 mmol/L (ref 3.5–5.1)
SODIUM: 135 mmol/L (ref 135–145)
TOTAL PROTEIN: 7.4 g/dL (ref 6.5–8.1)

## 2015-02-20 LAB — URINE DRUG SCREEN, QUALITATIVE (ARMC ONLY)
Amphetamines, Ur Screen: NOT DETECTED
BARBITURATES, UR SCREEN: NOT DETECTED
BENZODIAZEPINE, UR SCRN: NOT DETECTED
COCAINE METABOLITE, UR ~~LOC~~: NOT DETECTED
Cannabinoid 50 Ng, Ur ~~LOC~~: NOT DETECTED
MDMA (Ecstasy)Ur Screen: NOT DETECTED
Methadone Scn, Ur: NOT DETECTED
OPIATE, UR SCREEN: NOT DETECTED
PHENCYCLIDINE (PCP) UR S: NOT DETECTED
Tricyclic, Ur Screen: NOT DETECTED

## 2015-02-20 LAB — ETHANOL: Alcohol, Ethyl (B): 388 mg/dL (ref <5)

## 2015-02-20 MED ORDER — LORAZEPAM 1 MG PO TABS
1.0000 mg | ORAL_TABLET | Freq: Once | ORAL | Status: AC
  Administered 2015-02-20: 1 mg via ORAL
  Filled 2015-02-20: qty 1

## 2015-02-20 MED ORDER — LORAZEPAM 2 MG/ML IJ SOLN: 1.0000 mg | Freq: Once | INTRAMUSCULAR | Status: DC

## 2015-02-20 MED ORDER — LORAZEPAM 1 MG PO TABS: 1.0000 mg | ORAL_TABLET | Freq: Three times a day (TID) | ORAL | Status: AC | PRN

## 2015-02-20 NOTE — ED Notes (Signed)
Patient to ER for detox from ETOH. States he drinks 7-8 beers per day. Denies any illicit drug use.

## 2015-02-20 NOTE — ED Notes (Signed)

## 2015-02-20 NOTE — ED Provider Notes (Addendum)
-----------------------------------------   7:03 PM on 02/20/2015 -----------------------------------------  Patient no acute distress with no evidence of withdrawal at this time. He is waiting comfortably. EtOH is positive. Would like help with alcohol abuse. Denies SI or HI to me. Have referred him to our in-house psychiatric counselors and hopefully we can get him placement. I will give him Ativan while we wait. I will watch for signs of withdrawal.  Jeanmarie Plant, MD 02/20/15 1904  Patient in no acute distress has been given counseling regarding alcohol abuse, patient is alert and oriented and in no evidence of acute intoxication or withdrawal at this time. He is awaiting a ride home. He will follow-up as an outpatient. Return precautions and follow-up given and stress.  Jeanmarie Plant, MD 02/20/15 2049

## 2015-02-20 NOTE — ED Notes (Signed)
Lab called with etoh level .388   Dr Alphonzo Lemmings aware.

## 2015-02-20 NOTE — ED Provider Notes (Signed)
[facility] Emergency Department Provider Note     Time seen: ----------------------------------------- 2:34 PM on 02/20/2015 -----------------------------------------    I have reviewed the triage vital signs and the nursing notes.   HISTORY  Chief Complaint Alcohol Problem    HPI Zachary Boyd is a 44 y.o. male who presents requesting detox from alcohol. States he drinks 7-8 beers per day, denies any illicit drug use. Denies suicidal or homicidal ideation.   Past Medical History  Diagnosis Date  . Alcohol abuse   . Pneumothorax     There are no active problems to display for this patient.   History reviewed. No pertinent past surgical history.  Allergies Review of patient's allergies indicates no known allergies.  Social History Social History  Substance Use Topics  . Smoking status: Never Smoker   . Smokeless tobacco: Current User    Types: Chew  . Alcohol Use: Yes    Review of Systems Constitutional: Negative for fever. Eyes: Negative for visual changes. ENT: Negative for sore throat. Cardiovascular: Negative for chest pain. Respiratory: Negative for shortness of breath. Gastrointestinal: Negative for abdominal pain, vomiting and diarrhea. Genitourinary: Negative for dysuria. Musculoskeletal: Negative for back pain. Skin: Negative for rash. Neurological: Negative for headaches, focal weakness or numbness. Psychiatric:negative for suicidal or homicidal ideation.  10-point ROS otherwise negative.  ____________________________________________   PHYSICAL EXAM:  VITAL SIGNS: ED Triage Vitals  Enc Vitals Group     BP 02/20/15 1330 145/92 mmHg     Pulse Rate 02/20/15 1330 92     Resp 02/20/15 1330 16     Temp 02/20/15 1330 98.2 F (36.8 C)     Temp Source 02/20/15 1330 Oral     SpO2 02/20/15 1330 97 %     Weight 02/20/15 1330 170 lb (77.111 kg)     Height 02/20/15 1330  (1.702 m)     Head Cir --      Peak Flow --       Pain Score --      Pain Loc --      Pain Edu? --      Excl. in GC? --     Constitutional: Alert , appears intoxicated. No acute distress. Eyes: Conjunctivae are normal. PERRL. Normal extraocular movements. ENT   Head: Normocephalic and atraumatic.   Nose: No congestion/rhinnorhea.   Mouth/Throat: Mucous membranes are moist.   Neck: No stridor. Cardiovascular: Normal rate, regular rhythm. Normal and symmetric distal pulses are present in all extremities. No murmurs, rubs, or gallops. Respiratory: Normal respiratory effort without tachypnea nor retractions. Breath sounds are clear and equal bilaterally. No wheezes/rales/rhonchi. Gastrointestinal: Soft and nontender. No distention. No abdominal bruits.  Musculoskeletal: Nontender with normal range of motion in all extremities. No joint effusions.  No lower extremity tenderness nor edema. Neurologic:  Normal speech and language. No gross focal neurologic deficits are appreciated. Speech is normal. No gait instability. Skin:  Skin is warm, dry and intact. No rash noted. Psychiatric: Mood and affect are normal. Speech and behavior are normal. Patient exhibits appropriate insight and judgment. ____________________________________________  ED COURSE:  Pertinent labs & imaging results that were available during my care of the patient were reviewed by me and considered in my medical decision making (see chart for details). Patient presents for detox for alcohol. Currently medically stable but intoxicated. ____________________________________________    LABS (pertinent positives/negatives)  Labs Reviewed  CBC WITH DIFFERENTIAL/PLATELET  COMPREHENSIVE METABOLIC PANEL  URINALYSIS COMPLETEWITH MICROSCOPIC ([facility] ONLY)  URINE  DRUG SCREEN, QUALITATIVE ([facility] ONLY)  ETHANOL   ____________________________________________  FINAL ASSESSMENT AND PLAN  Alcohol intoxication, chronic alcoholism  Plan: Patient with labs and  imaging as dictated above. Patient's labs are pending, he is currently medically stable for detox.   Emily Filbert, MD   Emily Filbert, MD 02/20/15 1435

## 2015-02-20 NOTE — ED Notes (Signed)
MD at bedside., Dr.Williams  

## 2015-02-20 NOTE — ED Notes (Signed)
BEHAVIORAL HEALTH ROUNDING Patient sleeping: No. Patient alert and oriented: yes Behavior appropriate: Yes.  ; If no, describe:  Nutrition and fluids offered: Yes  Toileting and hygiene offered: Yes  Sitter present: yes Law enforcement present: Yes  

## 2015-02-20 NOTE — ED Notes (Signed)
Pt discharged with father.  Pt calm and cooperative.

## 2015-02-20 NOTE — ED Notes (Signed)
BEHAVIORAL HEALTH ROUNDING Patient sleeping: No. Patient alert and oriented: yes Behavior appropriate: Yes.  ; If no, describe:  Nutrition and fluids offered: Yes  Toileting and hygiene offered: Yes  Sitter present: no Law enforcement present: Yes  

## 2015-02-20 NOTE — Discharge Instructions (Signed)
Alcohol Use Disorder We are pleased that you will seek outpatient help for alcohol abuse. We are giving a prescription for Ativan which you can take if you start to feel shaky in between now and then. If you do feel you are going to withdrawal, return to medical attention. We do advise a follow-up very closely with the alcohol rehabilitation facility as discussed. Alcohol use disorder is a mental disorder. It is not a one-time incident of heavy drinking. Alcohol use disorder is the excessive and uncontrollable use of alcohol over time that leads to problems with functioning in one or more areas of daily living. People with this disorder risk harming themselves and others when they drink to excess. Alcohol use disorder also can cause other mental disorders, such as mood and anxiety disorders, and serious physical problems. People with alcohol use disorder often misuse other drugs.  Alcohol use disorder is common and widespread. Some people with this disorder drink alcohol to cope with or escape from negative life events. Others drink to relieve chronic pain or symptoms of mental illness. People with a family history of alcohol use disorder are at higher risk of losing control and using alcohol to excess.  SYMPTOMS  Signs and symptoms of alcohol use disorder may include the following:   Consumption ofalcohol inlarger amounts or over a longer period of time than intended.  Multiple unsuccessful attempts to cutdown or control alcohol use.   A great deal of time spent obtaining alcohol, using alcohol, or recovering from the effects of alcohol (hangover).  A strong desire or urge to use alcohol (cravings).   Continued use of alcohol despite problems at work, school, or home because of alcohol use.   Continued use of alcohol despite problems in relationships because of alcohol use.  Continued use of alcohol in situations when it is physically hazardous, such as driving a car.  Continued use of  alcohol despite awareness of a physical or psychological problem that is likely related to alcohol use. Physical problems related to alcohol use can involve the brain, heart, liver, stomach, and intestines. Psychological problems related to alcohol use include intoxication, depression, anxiety, psychosis, delirium, and dementia.   The need for increased amounts of alcohol to achieve the same desired effect, or a decreased effect from the consumption of the same amount of alcohol (tolerance).  Withdrawal symptoms upon reducing or stopping alcohol use, or alcohol use to reduce or avoid withdrawal symptoms. Withdrawal symptoms include:  Racing heart.  Hand tremor.  Difficulty sleeping.  Nausea.  Vomiting.  Hallucinations.  Restlessness.  Seizures. DIAGNOSIS Alcohol use disorder is diagnosed through an assessment by your health care provider. Your health care provider may start by asking three or four questions to screen for excessive or problematic alcohol use. To confirm a diagnosis of alcohol use disorder, at least two symptoms must be present within a 32-month period. The severity of alcohol use disorder depends on the number of symptoms:  Mild--two or three.  Moderate--four or five.  Severe--six or more. Your health care provider may perform a physical exam or use results from lab tests to see if you have physical problems resulting from alcohol use. Your health care provider may refer you to a mental health professional for evaluation. TREATMENT  Some people with alcohol use disorder are able to reduce their alcohol use to low-risk levels. Some people with alcohol use disorder need to quit drinking alcohol. When necessary, mental health professionals with specialized training in substance use treatment can  help. Your health care provider can help you decide how severe your alcohol use disorder is and what type of treatment you need. The following forms of treatment are available:     Detoxification. Detoxification involves the use of prescription medicines to prevent alcohol withdrawal symptoms in the first week after quitting. This is important for people with a history of symptoms of withdrawal and for heavy drinkers who are likely to have withdrawal symptoms. Alcohol withdrawal can be dangerous and, in severe cases, cause death. Detoxification is usually provided in a hospital or in-patient substance use treatment facility.  Counseling or talk therapy. Talk therapy is provided by substance use treatment counselors. It addresses the reasons people use alcohol and ways to keep them from drinking again. The goals of talk therapy are to help people with alcohol use disorder find healthy activities and ways to cope with life stress, to identify and avoid triggers for alcohol use, and to handle cravings, which can cause relapse.  Medicines.Different medicines can help treat alcohol use disorder through the following actions:  Decrease alcohol cravings.  Decrease the positive reward response felt from alcohol use.  Produce an uncomfortable physical reaction when alcohol is used (aversion therapy).  Support groups. Support groups are run by people who have quit drinking. They provide emotional support, advice, and guidance. These forms of treatment are often combined. Some people with alcohol use disorder benefit from intensive combination treatment provided by specialized substance use treatment centers. Both inpatient and outpatient treatment programs are available. Document Released: 06/25/2004 Document Revised: 10/02/2013 Document Reviewed: 08/25/2012 [facility] Patient Information 2015 [place], [place]. This information is not intended to replace advice given to you by your health care provider. Make sure you discuss any questions you have with your health care provider.

## 2015-02-20 NOTE — ED Notes (Signed)
Resumed care from greg rn.  Pt in hallway recliner.  Pt calm and  Cooperative.

## 2015-02-21 NOTE — [facility] Counselor (Signed)
Pt reported to the ED for detox.  Pt denies any SI/HI.  Pt reports drinking 7-8 beer.  Provided patient with contact information on [facility] in [place], [place] and informed patient that [facility] will have a bed available in the morning.

## 2015-07-17 ENCOUNTER — Emergency Department
Admission: EM | Admit: 2015-07-17 | Discharge: 2015-07-18 | Disposition: A | Discharge status: Home / Self Care | Payer: Self-pay | Attending: Emergency Medicine | Admitting: Emergency Medicine

## 2015-07-17 ENCOUNTER — Emergency Department: Payer: Self-pay

## 2015-07-17 ENCOUNTER — Encounter: Payer: Self-pay | Admitting: Emergency Medicine

## 2015-07-17 DIAGNOSIS — R112 Nausea with vomiting, unspecified: Secondary | ICD-10-CM

## 2015-07-17 DIAGNOSIS — R1013 Epigastric pain: Secondary | ICD-10-CM

## 2015-07-17 DIAGNOSIS — K7010 Alcoholic hepatitis without ascites: Secondary | ICD-10-CM | POA: Insufficient documentation

## 2015-07-17 DIAGNOSIS — IMO0002 Reserved for concepts with insufficient information to code with codable children: Secondary | ICD-10-CM

## 2015-07-17 DIAGNOSIS — F10120 Alcohol abuse with intoxication, uncomplicated: Secondary | ICD-10-CM | POA: Insufficient documentation

## 2015-07-17 HISTORY — DX: Unspecified asthma, uncomplicated: J45.909

## 2015-07-17 LAB — CBC
HCT: 41.2 % (ref 40.0–52.0)
HEMOGLOBIN: 14.2 g/dL (ref 13.0–18.0)
MCH: 32.4 pg (ref 26.0–34.0)
MCHC: 34.4 g/dL (ref 32.0–36.0)
MCV: 94.4 fL (ref 80.0–100.0)
PLATELETS: 308 10*3/uL (ref 150–440)
RBC: 4.36 MIL/uL — AB (ref 4.40–5.90)
RDW: 15 % — ABNORMAL HIGH (ref 11.5–14.5)
WBC: 5.2 10*3/uL (ref 3.8–10.6)

## 2015-07-17 LAB — URINE DRUG SCREEN, QUALITATIVE (ARMC ONLY)
Amphetamines, Ur Screen: NOT DETECTED
BARBITURATES, UR SCREEN: NOT DETECTED
Benzodiazepine, Ur Scrn: NOT DETECTED
CANNABINOID 50 NG, UR ~~LOC~~: NOT DETECTED
COCAINE METABOLITE, UR ~~LOC~~: NOT DETECTED
MDMA (ECSTASY) UR SCREEN: NOT DETECTED
Methadone Scn, Ur: NOT DETECTED
Opiate, Ur Screen: NOT DETECTED
PHENCYCLIDINE (PCP) UR S: NOT DETECTED
TRICYCLIC, UR SCREEN: NOT DETECTED

## 2015-07-17 LAB — COMPREHENSIVE METABOLIC PANEL
ALT: 112 U/L — ABNORMAL HIGH (ref 17–63)
ANION GAP: 16 — AB (ref 5–15)
AST: 373 U/L — ABNORMAL HIGH (ref 15–41)
Albumin: 4.1 g/dL (ref 3.5–5.0)
Alkaline Phosphatase: 127 U/L — ABNORMAL HIGH (ref 38–126)
BILIRUBIN TOTAL: 0.8 mg/dL (ref 0.3–1.2)
BUN: 11 mg/dL (ref 6–20)
CO2: 23 mmol/L (ref 22–32)
Calcium: 8.5 mg/dL — ABNORMAL LOW (ref 8.9–10.3)
Chloride: 94 mmol/L — ABNORMAL LOW (ref 101–111)
Creatinine, Ser: 0.97 mg/dL (ref 0.61–1.24)
GFR calc Af Amer: >60 mL/min (ref >60)
Glucose, Bld: 103 mg/dL — ABNORMAL HIGH (ref 65–99)
POTASSIUM: 3.8 mmol/L (ref 3.5–5.1)
Sodium: 133 mmol/L — ABNORMAL LOW (ref 135–145)
TOTAL PROTEIN: 7.9 g/dL (ref 6.5–8.1)

## 2015-07-17 LAB — ETHANOL: ALCOHOL ETHYL (B): 348 mg/dL — AB (ref <5)

## 2015-07-17 MED ORDER — SODIUM CHLORIDE 0.9 % IV BOLUS (SEPSIS)
1000.0000 mL | Freq: Once | INTRAVENOUS | Status: AC
  Administered 2015-07-17: 1000 mL via INTRAVENOUS

## 2015-07-17 MED ORDER — ONDANSETRON HCL 4 MG/2ML IJ SOLN
4.0000 mg | Freq: Once | INTRAMUSCULAR | Status: AC
  Administered 2015-07-17: 4 mg via INTRAVENOUS
  Filled 2015-07-17: qty 2

## 2015-07-17 NOTE — ED Notes (Signed)
Patient transported to Ultrasound 

## 2015-07-17 NOTE — ED Notes (Signed)
Patient transported to X-ray 

## 2015-07-17 NOTE — ED Notes (Signed)
Turkey sandwich tray given per MD 
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
## 2015-07-17 NOTE — ED Notes (Signed)
Patient escorted to room ED20

## 2015-07-17 NOTE — ED Notes (Signed)
Pt in via triage w/ complaints of vomiting for "a couple of weeks."  When asked about pain, pt states, "my stomach is burning."  Abdomen non-tender to palpitation.  Pt not vomiting at this time.  Pt w/ flat affect, tearful; gives very minimal information when asked any questions.  Pt reports last drink was today, stating, "I had one beer."  Pt hypertensive, tachycardic.

## 2015-07-17 NOTE — ED Notes (Signed)
Patient presents to the ED stating he has been binge drinking for the past two weeks.  Patient reports that he has been vomiting since Monday.  Patient reports drinking a 40oz beer today.  Patient is vomiting in triage and emesis appears pink tinged.  Family states patient has been "passing blood" in his stool since Christmas.  Family states patient has lost 25lbs in the last month.  Patient denies SI and HI.  Family reports history of SI and that patient told his mother that he was planning to jump off a bridge yesterday morning.  Patient is alert and oriented at this time.  Appears pale and skin is slightly clammy.

## 2015-07-17 NOTE — [facility] Counselor (Signed)
Pt presents to the ED requesting detox from alcohol.  Pt reports a two-week history of drinking 4-540 ounce beers a day. He reports periods of  vomiting 2-3 times a day for the past 2 weeks. Pt continues to denies any SI or HI.  He denies any symptoms of depression even though reports recently breaking up with his girlfriend and living alone.    [facility] Counselor provided pt with resources for detox.  Provided pt with information with [facility] ([facility]).  Pt states that he would be interested in the program at [facility] if a bed is available.  This Clinical research associate called and spoke to [place]  at [facility] who reports that there is a male bed available and that patient can be admitted once his BAC is within an acceptable range.

## 2015-07-17 NOTE — ED Notes (Signed)
Took patient applesauce, graham crackers, and ice water.  He was complaining of his stomach hurting and needing some food.  We are all out of the box lunches.

## 2015-07-18 LAB — ETHANOL: Alcohol, Ethyl (B): 170 mg/dL — ABNORMAL HIGH (ref <5)

## 2015-07-18 NOTE — Discharge Instructions (Signed)
You were seen in the emergency room for abdominal pain. It is important that you follow up closely with gastroenterology in the next couple of weeks.   You're being discharged to alcohol detox this evening.  If you're unable to see her primary care doctor or gastroentrology within the next few days you may return to the emergency room or go to the [facility]-in [facility] in 1 or 2 days for reexam.  Please return to the emergency room right away if you are to develop a fever, severe nausea, your pain becomes severe or worsens, you are unable to keep food down, begin vomiting any dark or bloody fluid, you develop any dark or bloody stools, feel dehydrated, or other new concerns or symptoms arise.

## 2015-07-18 NOTE — ED Provider Notes (Signed)
Filed Vitals:   07/17/15 1930 07/18/15 0246  BP: 132/95 127/93  Pulse: 122 103  Temp:  97.7 F (36.5 C)  Resp:  18     ----------------------------------------- 2:39 AM on 07/18/2015 -----------------------------------------  Patient being discharged to detox. A bed has been secured for him, and he is agreeable to go to detox for further treatment. Appears likely alcoholic hepatitis, and I have given recommendations for follow-up, return precautions, and gastroenterology.  Patient awake alert eating well and in no distress. Clinically no evidence of acute intoxication at this time. He speaking with clear speech, is well oriented, and plan to discharge to detox this evening.  Sharyn Creamer, MD 07/18/15 [phone]

## 2017-09-03 IMAGING — US US ABDOMEN LIMITED
1 series · 14 of 25 positions shown · non-contrast
Comparison: None.

CLINICAL DATA: 44-year-old male with 1 week history of epigastric
pain

EXAM:
US ABDOMEN LIMITED - RIGHT UPPER QUADRANT

[Series 1: us abdomen limited · 0.25mm/px · 14 of 42 slices shown]
[im 1/42]
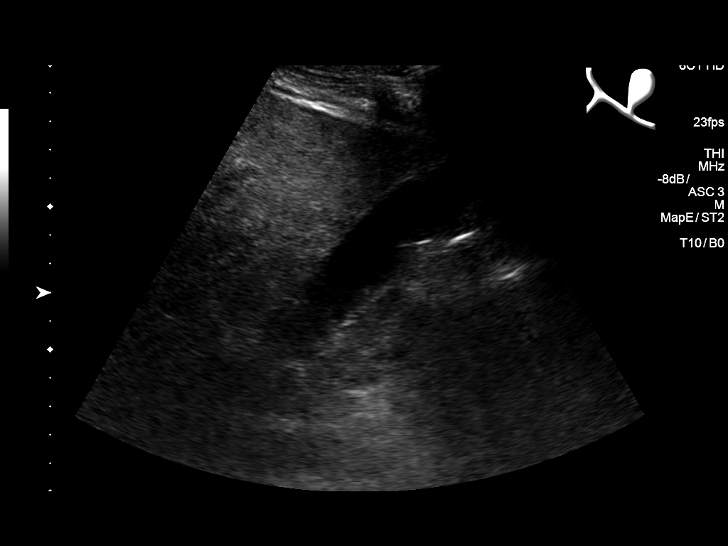
[im 4/42]
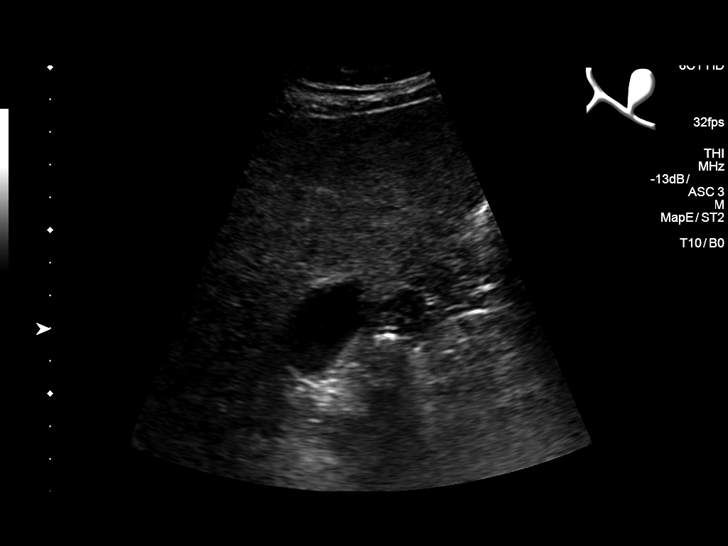
[im 7/42]
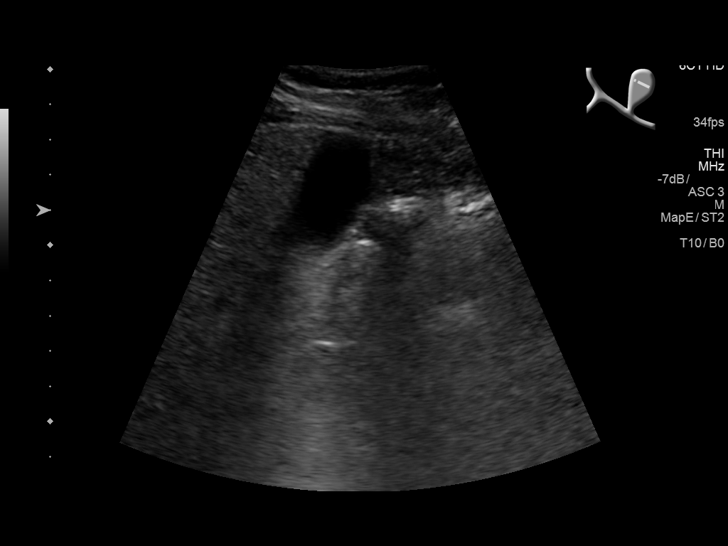
[im 11/42]
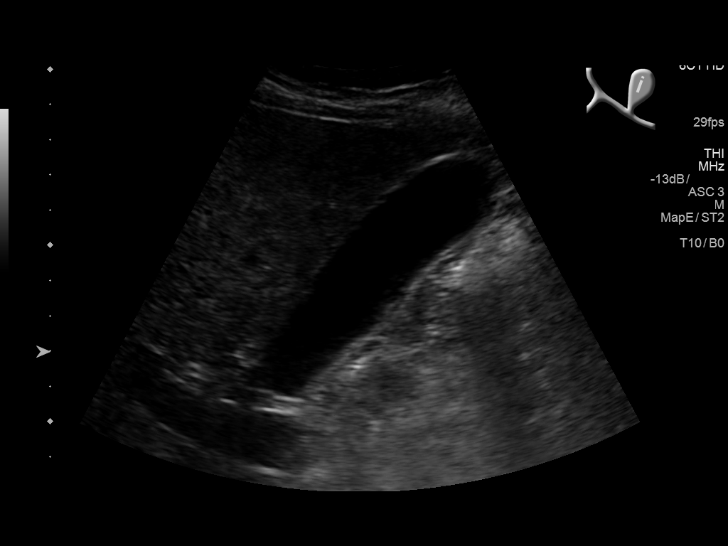
[im 14/42]
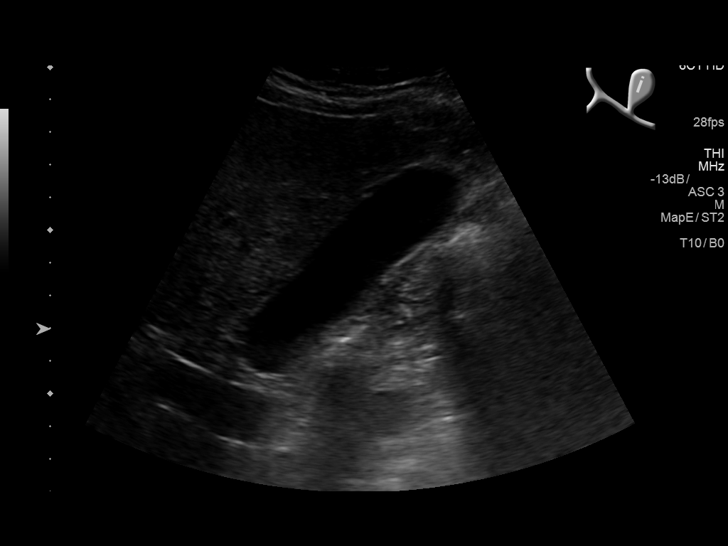
[im 16/42]
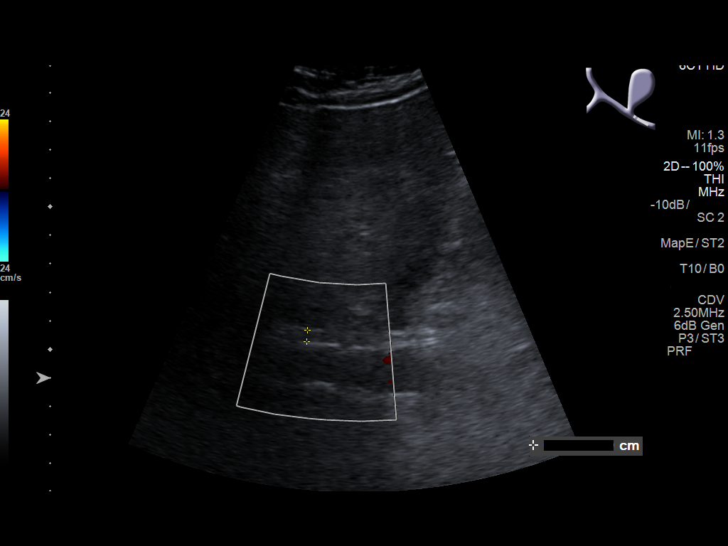
[im 19/42]
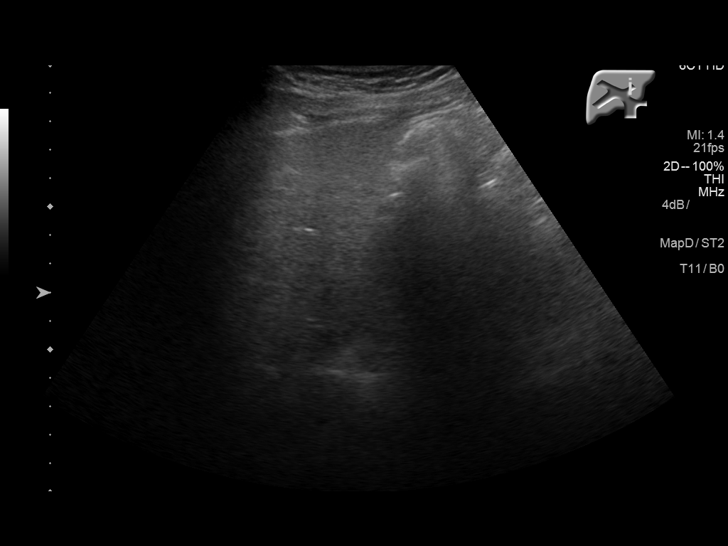
[im 23/42]
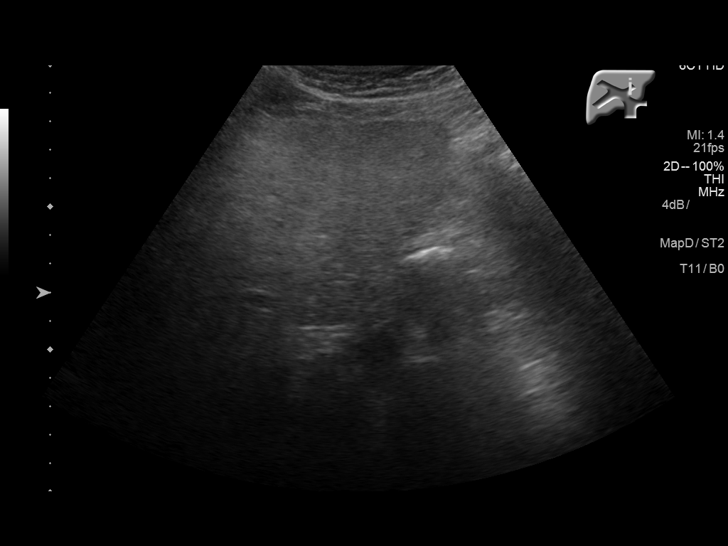
[im 26/42]
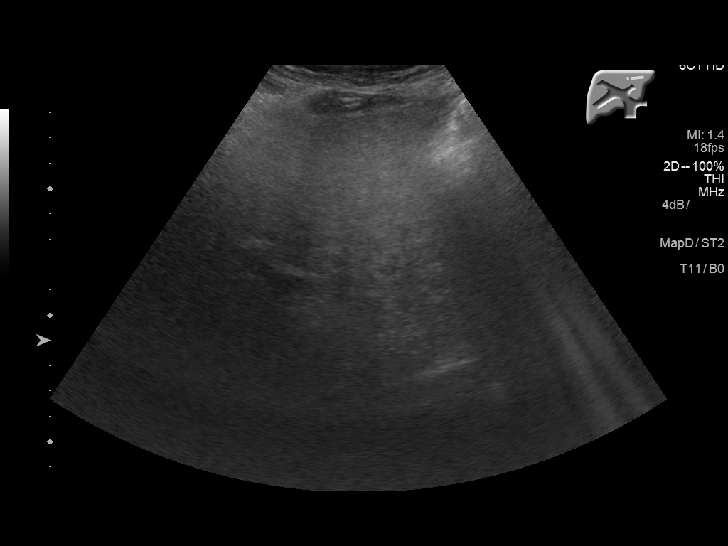
[im 28/42]
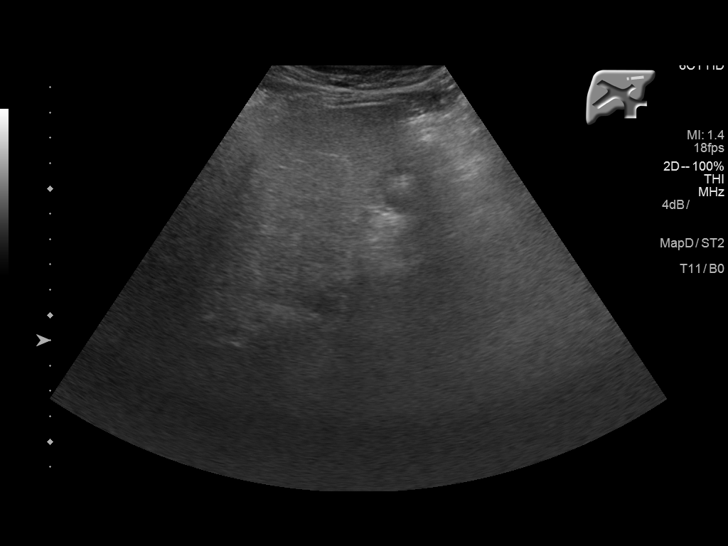
[im 31/42]
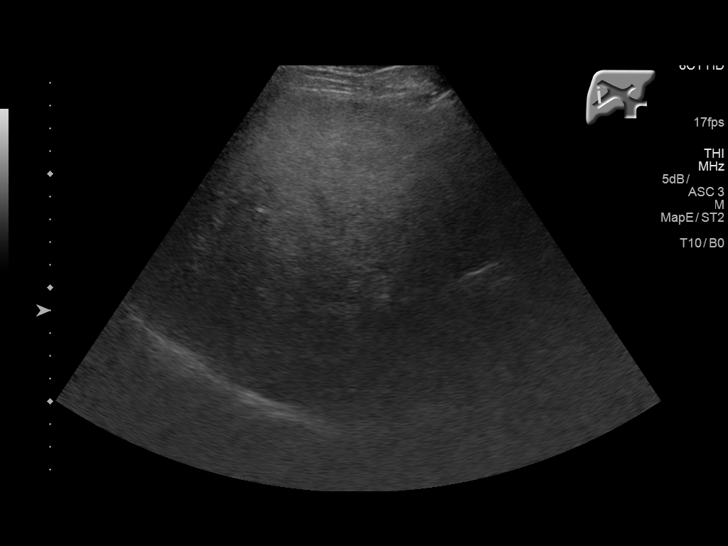
[im 35/42]
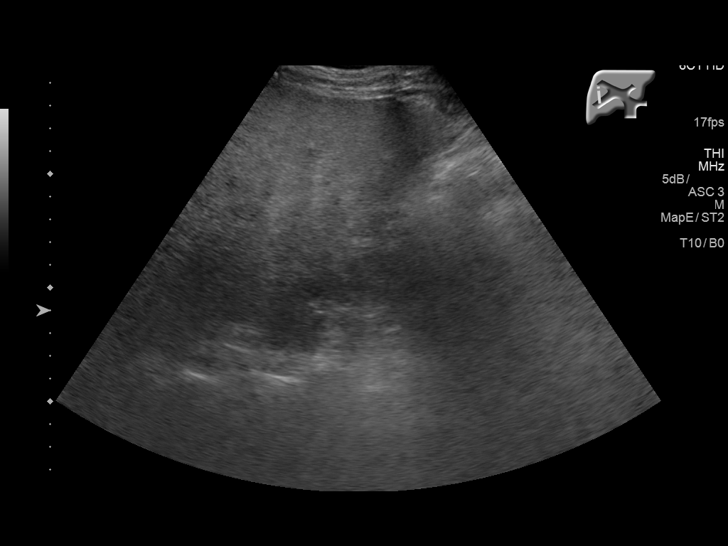
[im 38/42]
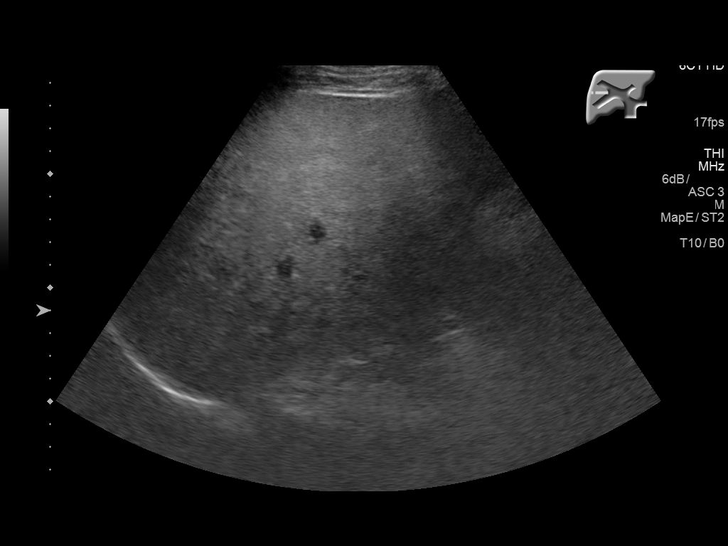
[im 42/42]
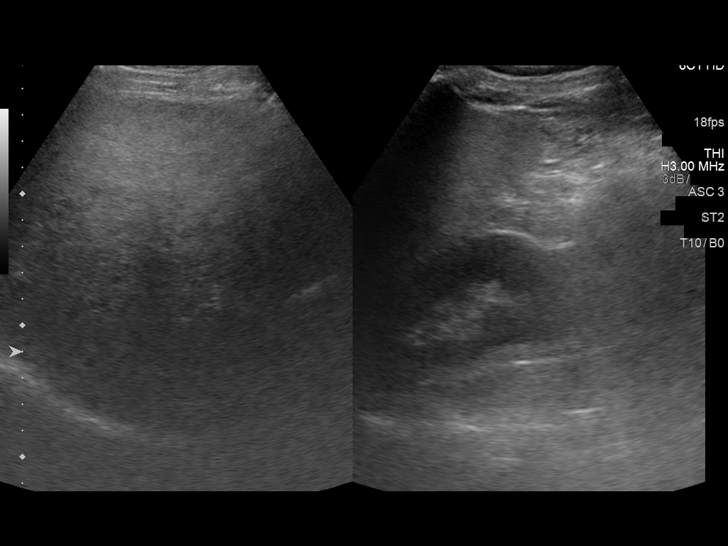

[14 of 25 positions shown; findings below may reference images not displayed]

FINDINGS: Gallbladder:

No gallstones or wall thickening visualized. No sonographic Murphy
sign noted by sonographer.

Common bile duct:

Diameter: Within normal limits at 4 mm

Liver:

No focal lesion identified. The deep increased hepatic parenchymal
echogenicity with coarsening of the echotexture. The adjacent renal
parenchyma is hypoechoic in comparison. The main portal vein is
patent.
IMPRESSION: 1. Negative for cholelithiasis or evidence of acute cholecystitis.
2. Hepatic steatosis.
